# Patient Record
Sex: Female | Born: 1955 | Race: White | Hispanic: No | Marital: Married | Smoking: Never smoker
Health system: Southern US, Community
[De-identification: ages and names within clinical notes are randomized; demographics above are authoritative.]

## PROBLEM LIST (undated history)

## (undated) DIAGNOSIS — A630 Anogenital (venereal) warts: Secondary | ICD-10-CM

## (undated) HISTORY — PX: THYROID CYST EXCISION: SHX2511

## (undated) HISTORY — PX: TONSILLECTOMY: SUR1361

## (undated) HISTORY — PX: WISDOM TOOTH EXTRACTION: SHX21

## (undated) HISTORY — DX: Anogenital (venereal) warts: A63.0

---

## 2012-09-11 ENCOUNTER — Other Ambulatory Visit: Payer: Self-pay | Admitting: Obstetrics & Gynecology

## 2012-09-11 DIAGNOSIS — N6452 Nipple discharge: Secondary | ICD-10-CM

## 2012-09-17 ENCOUNTER — Ambulatory Visit
Admission: RE | Admit: 2012-09-17 | Discharge: 2012-09-17 | Disposition: A | Discharge status: Home / Self Care | Payer: Self-pay | Source: Ambulatory Visit | Attending: Obstetrics & Gynecology | Admitting: Obstetrics & Gynecology

## 2012-09-17 ENCOUNTER — Other Ambulatory Visit: Payer: Self-pay | Admitting: Obstetrics & Gynecology

## 2012-09-17 DIAGNOSIS — N6452 Nipple discharge: Secondary | ICD-10-CM

## 2012-10-02 ENCOUNTER — Ambulatory Visit
Admission: RE | Admit: 2012-10-02 | Discharge: 2012-10-02 | Disposition: A | Discharge status: Home / Self Care | Payer: 59 | Source: Ambulatory Visit | Attending: Obstetrics & Gynecology | Admitting: Obstetrics & Gynecology

## 2012-10-02 DIAGNOSIS — N6452 Nipple discharge: Secondary | ICD-10-CM

## 2014-04-30 ENCOUNTER — Other Ambulatory Visit: Payer: Self-pay

## 2014-04-30 DIAGNOSIS — Z1231 Encounter for screening mammogram for malignant neoplasm of breast: Secondary | ICD-10-CM

## 2014-05-01 ENCOUNTER — Ambulatory Visit
Admission: RE | Admit: 2014-05-01 | Discharge: 2014-05-01 | Disposition: A | Discharge status: Home / Self Care | Payer: 59 | Source: Ambulatory Visit

## 2014-05-01 DIAGNOSIS — Z1231 Encounter for screening mammogram for malignant neoplasm of breast: Secondary | ICD-10-CM

## 2014-08-08 ENCOUNTER — Other Ambulatory Visit: Payer: Self-pay | Admitting: Nurse Practitioner

## 2014-08-08 DIAGNOSIS — N643 Galactorrhea not associated with childbirth: Secondary | ICD-10-CM

## 2014-11-14 ENCOUNTER — Other Ambulatory Visit: Payer: Self-pay | Admitting: Nurse Practitioner

## 2014-11-14 DIAGNOSIS — N643 Galactorrhea not associated with childbirth: Secondary | ICD-10-CM

## 2015-04-13 ENCOUNTER — Other Ambulatory Visit: Payer: Self-pay

## 2015-04-13 DIAGNOSIS — Z1231 Encounter for screening mammogram for malignant neoplasm of breast: Secondary | ICD-10-CM

## 2015-05-12 ENCOUNTER — Ambulatory Visit
Admission: RE | Admit: 2015-05-12 | Discharge: 2015-05-12 | Disposition: A | Discharge status: Home / Self Care | Payer: 59 | Source: Ambulatory Visit | Attending: Nurse Practitioner | Admitting: Nurse Practitioner

## 2015-05-12 ENCOUNTER — Ambulatory Visit
Admission: RE | Admit: 2015-05-12 | Discharge: 2015-05-12 | Disposition: A | Discharge status: Home / Self Care | Payer: 59 | Source: Ambulatory Visit

## 2015-05-12 ENCOUNTER — Other Ambulatory Visit: Payer: Self-pay

## 2015-05-12 ENCOUNTER — Other Ambulatory Visit: Payer: Self-pay | Admitting: Nurse Practitioner

## 2015-05-12 DIAGNOSIS — N643 Galactorrhea not associated with childbirth: Secondary | ICD-10-CM

## 2015-05-12 DIAGNOSIS — Z1231 Encounter for screening mammogram for malignant neoplasm of breast: Secondary | ICD-10-CM

## 2015-05-13 ENCOUNTER — Other Ambulatory Visit: Payer: Self-pay | Admitting: Nurse Practitioner

## 2015-05-13 DIAGNOSIS — N6452 Nipple discharge: Secondary | ICD-10-CM

## 2015-05-20 ENCOUNTER — Other Ambulatory Visit: Payer: Self-pay

## 2015-05-20 ENCOUNTER — Ambulatory Visit
Admission: RE | Admit: 2015-05-20 | Discharge: 2015-05-20 | Disposition: A | Discharge status: Home / Self Care | Payer: 59 | Source: Ambulatory Visit | Attending: Nurse Practitioner | Admitting: Nurse Practitioner

## 2015-05-20 ENCOUNTER — Other Ambulatory Visit: Payer: Self-pay | Admitting: Nurse Practitioner

## 2015-05-20 DIAGNOSIS — N6452 Nipple discharge: Secondary | ICD-10-CM

## 2015-05-20 MED ORDER — GADOBENATE DIMEGLUMINE 529 MG/ML IV SOLN
11.0000 mL | Freq: Once | INTRAVENOUS | Status: AC | PRN
  Administered 2015-05-20: 11 mL via INTRAVENOUS

## 2015-05-25 ENCOUNTER — Ambulatory Visit
Admission: RE | Admit: 2015-05-25 | Discharge: 2015-05-25 | Disposition: A | Discharge status: Home / Self Care | Payer: 59 | Source: Ambulatory Visit | Attending: Nurse Practitioner | Admitting: Nurse Practitioner

## 2015-05-25 DIAGNOSIS — N6452 Nipple discharge: Secondary | ICD-10-CM

## 2015-05-27 ENCOUNTER — Ambulatory Visit
Admission: RE | Admit: 2015-05-27 | Discharge: 2015-05-27 | Disposition: A | Discharge status: Home / Self Care | Payer: 59 | Source: Ambulatory Visit | Attending: Nurse Practitioner | Admitting: Nurse Practitioner

## 2015-05-29 ENCOUNTER — Other Ambulatory Visit: Payer: Self-pay | Admitting: General Surgery

## 2015-05-29 ENCOUNTER — Other Ambulatory Visit: Payer: Self-pay | Admitting: Nurse Practitioner

## 2015-05-29 DIAGNOSIS — R9389 Abnormal findings on diagnostic imaging of other specified body structures: Secondary | ICD-10-CM

## 2015-05-29 DIAGNOSIS — N63 Unspecified lump in unspecified breast: Secondary | ICD-10-CM

## 2015-06-04 ENCOUNTER — Ambulatory Visit
Admission: RE | Admit: 2015-06-04 | Discharge: 2015-06-04 | Disposition: A | Discharge status: Home / Self Care | Payer: 59 | Source: Ambulatory Visit | Attending: General Surgery | Admitting: General Surgery

## 2015-06-04 DIAGNOSIS — N63 Unspecified lump in unspecified breast: Secondary | ICD-10-CM

## 2015-06-04 DIAGNOSIS — R9389 Abnormal findings on diagnostic imaging of other specified body structures: Secondary | ICD-10-CM

## 2015-06-04 HISTORY — PX: BREAST BIOPSY: SHX20

## 2015-06-04 MED ORDER — GADOBENATE DIMEGLUMINE 529 MG/ML IV SOLN
10.0000 mL | Freq: Once | INTRAVENOUS | Status: AC | PRN
  Administered 2015-06-04: 10 mL via INTRAVENOUS

## 2015-06-09 ENCOUNTER — Other Ambulatory Visit: Payer: Self-pay | Admitting: General Surgery

## 2015-06-09 DIAGNOSIS — D241 Benign neoplasm of right breast: Secondary | ICD-10-CM

## 2015-06-24 ENCOUNTER — Other Ambulatory Visit: Payer: Self-pay | Admitting: General Surgery

## 2015-06-24 DIAGNOSIS — D241 Benign neoplasm of right breast: Secondary | ICD-10-CM

## 2015-07-08 ENCOUNTER — Encounter (HOSPITAL_BASED_OUTPATIENT_CLINIC_OR_DEPARTMENT_OTHER): Payer: Self-pay | Admitting: *Deleted

## 2015-07-15 ENCOUNTER — Ambulatory Visit
Admission: RE | Admit: 2015-07-15 | Discharge: 2015-07-15 | Disposition: A | Discharge status: Home / Self Care | Payer: 59 | Source: Ambulatory Visit | Attending: General Surgery | Admitting: General Surgery

## 2015-07-15 ENCOUNTER — Encounter (HOSPITAL_BASED_OUTPATIENT_CLINIC_OR_DEPARTMENT_OTHER)
Admission: RE | Admit: 2015-07-15 | Discharge: 2015-07-15 | Disposition: A | Discharge status: Home / Self Care | Payer: 59 | Source: Ambulatory Visit | Attending: General Surgery | Admitting: General Surgery

## 2015-07-15 DIAGNOSIS — D241 Benign neoplasm of right breast: Secondary | ICD-10-CM

## 2015-07-15 NOTE — Progress Notes (Signed)
Boost given to pt and instructed to complete by 0415, pt verbalized understanding.

## 2015-07-16 ENCOUNTER — Ambulatory Visit (HOSPITAL_BASED_OUTPATIENT_CLINIC_OR_DEPARTMENT_OTHER)
Admission: RE | Admit: 2015-07-16 | Discharge: 2015-07-16 | Disposition: A | Discharge status: Home / Self Care | Payer: 59 | Source: Ambulatory Visit | Attending: General Surgery | Admitting: General Surgery

## 2015-07-16 ENCOUNTER — Ambulatory Visit (HOSPITAL_BASED_OUTPATIENT_CLINIC_OR_DEPARTMENT_OTHER): Payer: 59 | Admitting: Certified Registered"

## 2015-07-16 ENCOUNTER — Ambulatory Visit
Admission: RE | Admit: 2015-07-16 | Discharge: 2015-07-16 | Disposition: A | Discharge status: Home / Self Care | Payer: 59 | Source: Ambulatory Visit | Attending: General Surgery | Admitting: General Surgery

## 2015-07-16 ENCOUNTER — Encounter (HOSPITAL_BASED_OUTPATIENT_CLINIC_OR_DEPARTMENT_OTHER)
Admission: RE | Disposition: A | Discharge status: Home / Self Care | Payer: Self-pay | Source: Ambulatory Visit | Attending: General Surgery

## 2015-07-16 ENCOUNTER — Encounter (HOSPITAL_BASED_OUTPATIENT_CLINIC_OR_DEPARTMENT_OTHER): Payer: Self-pay | Admitting: *Deleted

## 2015-07-16 DIAGNOSIS — D241 Benign neoplasm of right breast: Secondary | ICD-10-CM | POA: Diagnosis present

## 2015-07-16 HISTORY — PX: BREAST LUMPECTOMY WITH RADIOACTIVE SEED LOCALIZATION: SHX6424

## 2015-07-16 HISTORY — PX: BREAST EXCISIONAL BIOPSY: SUR124

## 2015-07-16 SURGERY — BREAST LUMPECTOMY WITH RADIOACTIVE SEED LOCALIZATION
Anesthesia: General | Site: Breast | Laterality: Right

## 2015-07-16 MED ORDER — HEPARIN SOD (PORK) LOCK FLUSH 100 UNIT/ML IV SOLN
INTRAVENOUS | Status: AC
  Filled 2015-07-16: qty 5

## 2015-07-16 MED ORDER — HEPARIN (PORCINE) IN NACL 2-0.9 UNIT/ML-% IJ SOLN
INTRAMUSCULAR | Status: AC
  Filled 2015-07-16: qty 500

## 2015-07-16 MED ORDER — FENTANYL CITRATE (PF) 100 MCG/2ML IJ SOLN: 25.0000 ug | INTRAMUSCULAR | Status: DC | PRN

## 2015-07-16 MED ORDER — BUPIVACAINE HCL (PF) 0.25 % IJ SOLN
INTRAMUSCULAR | Status: AC
  Filled 2015-07-16: qty 120

## 2015-07-16 MED ORDER — MIDAZOLAM HCL 2 MG/2ML IJ SOLN
INTRAMUSCULAR | Status: AC
  Filled 2015-07-16: qty 2

## 2015-07-16 MED ORDER — LIDOCAINE 2% (20 MG/ML) 5 ML SYRINGE
INTRAMUSCULAR | Status: AC
  Filled 2015-07-16: qty 5

## 2015-07-16 MED ORDER — DEXAMETHASONE SODIUM PHOSPHATE 10 MG/ML IJ SOLN
INTRAMUSCULAR | Status: AC
  Filled 2015-07-16: qty 1

## 2015-07-16 MED ORDER — HYDROCODONE-ACETAMINOPHEN 5-325 MG PO TABS
ORAL_TABLET | ORAL | Status: AC
  Filled 2015-07-16: qty 1

## 2015-07-16 MED ORDER — HYDROCODONE-ACETAMINOPHEN 5-325 MG PO TABS
1.0000 | ORAL_TABLET | Freq: Once | ORAL | Status: AC
  Administered 2015-07-16: 1 via ORAL

## 2015-07-16 MED ORDER — BUPIVACAINE-EPINEPHRINE (PF) 0.25% -1:200000 IJ SOLN
INTRAMUSCULAR | Status: AC
  Filled 2015-07-16: qty 120

## 2015-07-16 MED ORDER — PROMETHAZINE HCL 25 MG/ML IJ SOLN: 6.2500 mg | INTRAMUSCULAR | Status: DC | PRN

## 2015-07-16 MED ORDER — PROPOFOL 500 MG/50ML IV EMUL
INTRAVENOUS | Status: AC
  Filled 2015-07-16: qty 50

## 2015-07-16 MED ORDER — DEXAMETHASONE SODIUM PHOSPHATE 4 MG/ML IJ SOLN
INTRAMUSCULAR | Status: DC | PRN
  Administered 2015-07-16: 10 mg via INTRAVENOUS

## 2015-07-16 MED ORDER — SCOPOLAMINE 1 MG/3DAYS TD PT72
1.0000 | MEDICATED_PATCH | Freq: Once | TRANSDERMAL | Status: DC | PRN
Start: 2015-07-16 — End: 2015-07-16

## 2015-07-16 MED ORDER — MIDAZOLAM HCL 2 MG/2ML IJ SOLN
1.0000 mg | INTRAMUSCULAR | Status: DC | PRN
  Administered 2015-07-16: 1 mg via INTRAVENOUS

## 2015-07-16 MED ORDER — CHLORHEXIDINE GLUCONATE 4 % EX LIQD: 1.0000 "application " | Freq: Once | CUTANEOUS | Status: DC

## 2015-07-16 MED ORDER — HYDROCODONE-ACETAMINOPHEN 5-325 MG PO TABS: 1.0000 | ORAL_TABLET | Freq: Four times a day (QID) | ORAL | Status: DC | PRN

## 2015-07-16 MED ORDER — BUPIVACAINE HCL (PF) 0.25 % IJ SOLN
INTRAMUSCULAR | Status: DC | PRN
  Administered 2015-07-16: 20 mL

## 2015-07-16 MED ORDER — PROPOFOL 10 MG/ML IV BOLUS
INTRAVENOUS | Status: DC | PRN
  Administered 2015-07-16: 120 mg via INTRAVENOUS

## 2015-07-16 MED ORDER — CEFAZOLIN SODIUM-DEXTROSE 2-4 GM/100ML-% IV SOLN
2.0000 g | INTRAVENOUS | Status: AC
  Administered 2015-07-16: 2 g via INTRAVENOUS

## 2015-07-16 MED ORDER — FENTANYL CITRATE (PF) 100 MCG/2ML IJ SOLN
INTRAMUSCULAR | Status: AC
  Filled 2015-07-16: qty 2

## 2015-07-16 MED ORDER — ONDANSETRON HCL 4 MG/2ML IJ SOLN
INTRAMUSCULAR | Status: AC
  Filled 2015-07-16: qty 2

## 2015-07-16 MED ORDER — IOPAMIDOL (ISOVUE-300) INJECTION 61%
INTRAVENOUS | Status: AC
  Filled 2015-07-16: qty 50

## 2015-07-16 MED ORDER — LACTATED RINGERS IV SOLN
INTRAVENOUS | Status: DC
  Administered 2015-07-16 (×2): via INTRAVENOUS

## 2015-07-16 MED ORDER — GLYCOPYRROLATE 0.2 MG/ML IJ SOLN
0.2000 mg | Freq: Once | INTRAMUSCULAR | Status: DC | PRN
Start: 2015-07-16 — End: 2015-07-16

## 2015-07-16 MED ORDER — ONDANSETRON HCL 4 MG/2ML IJ SOLN
INTRAMUSCULAR | Status: DC | PRN
  Administered 2015-07-16: 4 mg via INTRAVENOUS

## 2015-07-16 MED ORDER — LIDOCAINE HCL (CARDIAC) 20 MG/ML IV SOLN
INTRAVENOUS | Status: DC | PRN
  Administered 2015-07-16: 100 mg via INTRAVENOUS

## 2015-07-16 MED ORDER — CEFAZOLIN SODIUM-DEXTROSE 2-4 GM/100ML-% IV SOLN
INTRAVENOUS | Status: AC
  Filled 2015-07-16: qty 100

## 2015-07-16 MED ORDER — FENTANYL CITRATE (PF) 100 MCG/2ML IJ SOLN
50.0000 ug | INTRAMUSCULAR | Status: DC | PRN
  Administered 2015-07-16: 50 ug via INTRAVENOUS

## 2015-07-16 SURGICAL SUPPLY — 43 items
APPLIER CLIP 9.375 MED OPEN (MISCELLANEOUS) ×3
BLADE SURG 15 STRL LF DISP TIS (BLADE) ×1 IMPLANT
BLADE SURG 15 STRL SS (BLADE) ×2
CANISTER SUC SOCK COL 7IN (MISCELLANEOUS) ×3 IMPLANT
CANISTER SUCT 1200ML W/VALVE (MISCELLANEOUS) ×3 IMPLANT
CHLORAPREP W/TINT 26ML (MISCELLANEOUS) ×3 IMPLANT
CLIP APPLIE 9.375 MED OPEN (MISCELLANEOUS) ×1 IMPLANT
COVER BACK TABLE 60X90IN (DRAPES) ×3 IMPLANT
COVER MAYO STAND STRL (DRAPES) ×3 IMPLANT
COVER PROBE W GEL 5X96 (DRAPES) ×3 IMPLANT
DECANTER SPIKE VIAL GLASS SM (MISCELLANEOUS) IMPLANT
DEVICE DUBIN W/COMP PLATE 8390 (MISCELLANEOUS) ×3 IMPLANT
DRAPE LAPAROSCOPIC ABDOMINAL (DRAPES) ×3 IMPLANT
DRAPE UTILITY XL STRL (DRAPES) ×3 IMPLANT
ELECT COATED BLADE 2.86 ST (ELECTRODE) ×3 IMPLANT
ELECT REM PT RETURN 9FT ADLT (ELECTROSURGICAL) ×3
ELECTRODE REM PT RTRN 9FT ADLT (ELECTROSURGICAL) ×1 IMPLANT
GLOVE BIO SURGEON STRL SZ 6.5 (GLOVE) ×8 IMPLANT
GLOVE BIO SURGEON STRL SZ7.5 (GLOVE) ×6 IMPLANT
GLOVE BIO SURGEONS STRL SZ 6.5 (GLOVE) ×4
GLOVE BIOGEL PI IND STRL 7.0 (GLOVE) ×4 IMPLANT
GLOVE BIOGEL PI INDICATOR 7.0 (GLOVE) ×8
GOWN STRL REUS W/ TWL LRG LVL3 (GOWN DISPOSABLE) ×4 IMPLANT
GOWN STRL REUS W/TWL LRG LVL3 (GOWN DISPOSABLE) ×8
ILLUMINATOR WAVEGUIDE N/F (MISCELLANEOUS) IMPLANT
KIT MARKER MARGIN INK (KITS) ×3 IMPLANT
LIGHT WAVEGUIDE WIDE FLAT (MISCELLANEOUS) IMPLANT
LIQUID BAND (GAUZE/BANDAGES/DRESSINGS) ×3 IMPLANT
NEEDLE HYPO 25X1 1.5 SAFETY (NEEDLE) ×3 IMPLANT
NS IRRIG 1000ML POUR BTL (IV SOLUTION) ×3 IMPLANT
PACK BASIN DAY SURGERY FS (CUSTOM PROCEDURE TRAY) ×3 IMPLANT
PENCIL BUTTON HOLSTER BLD 10FT (ELECTRODE) ×3 IMPLANT
SLEEVE SCD COMPRESS KNEE MED (MISCELLANEOUS) ×3 IMPLANT
SPONGE LAP 18X18 X RAY DECT (DISPOSABLE) ×3 IMPLANT
SUT MON AB 4-0 PC3 18 (SUTURE) IMPLANT
SUT SILK 2 0 SH (SUTURE) IMPLANT
SUT VICRYL 3-0 CR8 SH (SUTURE) ×3 IMPLANT
SYR CONTROL 10ML LL (SYRINGE) ×3 IMPLANT
TOWEL OR 17X24 6PK STRL BLUE (TOWEL DISPOSABLE) ×3 IMPLANT
TOWEL OR NON WOVEN STRL DISP B (DISPOSABLE) ×3 IMPLANT
TUBE CONNECTING 20'X1/4 (TUBING) ×1
TUBE CONNECTING 20X1/4 (TUBING) ×2 IMPLANT
YANKAUER SUCT BULB TIP NO VENT (SUCTIONS) IMPLANT

## 2015-07-16 NOTE — Op Note (Signed)
07/16/2015  8:23 AM  PATIENT:  Christina Gross  60 y.o. female  PRE-OPERATIVE DIAGNOSIS:  Right breast papilloma   POST-OPERATIVE DIAGNOSIS:  Right breast papilloma   PROCEDURE:  Procedure(s): RIGHT BREAST LUMPECTOMY WITH RADIOACTIVE SEED LOCALIZATION (Right)  SURGEON:  Surgeon(s) and Role:    * Jovita Kussmaul, MD - Primary  PHYSICIAN ASSISTANT:   ASSISTANTS: none   ANESTHESIA:   general  EBL:  Total I/O In: 1000 [I.V.:1000] Out: -   BLOOD ADMINISTERED:none  DRAINS: none   LOCAL MEDICATIONS USED:  MARCAINE     SPECIMEN:  Source of Specimen:  right breast tissue  DISPOSITION OF SPECIMEN:  PATHOLOGY  COUNTS:  YES  TOURNIQUET:  * No tourniquets in log *  DICTATION: .Dragon Dictation   After informed consent was obtained the patient was brought to the operating room and placed in the supine position on the operating table. After adequate induction of general anesthesia the patient's right breast was prepped with ChloraPrep, allowed to dry, and draped in usual sterile manner. An appropriate timeout was performed. Previously an I-125 seed was placed in the upper portion of the right breast to mark an area of an intraductal papilloma. The neoprobe was set to I-125 in the area of radioactivity was readily identified in the upper portion of the right breast near the areola. The area around this spot was infiltrated with quarter percent Marcaine. A small curvilinear incision was made along the upper edge of the areola on the right breast with a 15 blade knife. The incision was carried through the skin and subcutaneous tissue sharply with electrocautery.  Skin hooks were used for retraction. While checking the area of radioactivity frequently with the neoprobe a circular portion of breast tissue was excised sharply around the radioactive seed with the electrocautery. Once the specimen was removed it was oriented with the appropriate paint colors. A specimen radiograph was obtained  that showed the clip and seed to be near the center of the specimen. The specimen was then sent to pathology for further evaluation. Hemostasis was achieved using the Bovie electrocautery. The wound was irrigated with saline and infiltrated with more quarter percent Marcaine. The deep layer of the wound was closed with layers of interrupted 3-0 Vicryl stitches. The skin was then closed with interrupted 4-0 Monocryl subcuticular stitches. Dermabond dressings were applied. The patient tolerated the procedure well. At the end of the case all needle sponge and instrument counts were correct. The patient was then awakened and taken to recovery in stable condition.  PLAN OF CARE: Discharge to home after PACU  PATIENT DISPOSITION:  PACU - hemodynamically stable.   Delay start of Pharmacological VTE agent (>24hrs) due to surgical blood loss or risk of bleeding: not applicable

## 2015-07-16 NOTE — Anesthesia Preprocedure Evaluation (Addendum)
Anesthesia Evaluation  Patient identified by MRN, date of birth, ID band Patient awake    Reviewed: Allergy & Precautions, NPO status , Patient's Chart, lab work & pertinent test results  Airway Mallampati: II  TM Distance: >3 FB Neck ROM: Full    Dental  (+) Teeth Intact, Dental Advisory Given   Pulmonary neg pulmonary ROS,    Pulmonary exam normal breath sounds clear to auscultation       Cardiovascular Exercise Tolerance: Good negative cardio ROS Normal cardiovascular exam Rhythm:Regular Rate:Normal     Neuro/Psych negative neurological ROS     GI/Hepatic negative GI ROS, Neg liver ROS,   Endo/Other  negative endocrine ROS  Renal/GU negative Renal ROS     Musculoskeletal negative musculoskeletal ROS (+)   Abdominal   Peds  Hematology negative hematology ROS (+)   Anesthesia Other Findings Day of surgery medications reviewed with the patient.  Right breast cancer  Reproductive/Obstetrics                           Anesthesia Physical Anesthesia Plan  ASA: II  Anesthesia Plan: General   Post-op Pain Management:    Induction: Intravenous  Airway Management Planned: LMA  Additional Equipment:   Intra-op Plan:   Post-operative Plan: Extubation in OR  Informed Consent: I have reviewed the patients History and Physical, chart, labs and discussed the procedure including the risks, benefits and alternatives for the proposed anesthesia with the patient or authorized representative who has indicated his/her understanding and acceptance.   Dental advisory given  Plan Discussed with: CRNA  Anesthesia Plan Comments: (Risks/benefits of general anesthesia discussed with patient including risk of damage to teeth, lips, gum, and tongue, nausea/vomiting, allergic reactions to medications, and the possibility of heart attack, stroke and death.  All patient questions answered.  Patient wishes  to proceed.)        Anesthesia Quick Evaluation

## 2015-07-16 NOTE — Anesthesia Procedure Notes (Signed)
Procedure Name: LMA Insertion Date/Time: 07/16/2015 7:30 AM Performed by: Deuntae Kocsis D Pre-anesthesia Checklist: Patient identified, Emergency Drugs available, Suction available and Patient being monitored Patient Re-evaluated:Patient Re-evaluated prior to inductionOxygen Delivery Method: Circle system utilized Preoxygenation: Pre-oxygenation with 100% oxygen Intubation Type: IV induction Ventilation: Mask ventilation without difficulty LMA: LMA inserted LMA Size: 4.0 Number of attempts: 1 Airway Equipment and Method: Bite block Placement Confirmation: positive ETCO2 Tube secured with: Tape Dental Injury: Teeth and Oropharynx as per pre-operative assessment

## 2015-07-16 NOTE — Anesthesia Postprocedure Evaluation (Signed)
Anesthesia Post Note  Patient: Christina Gross  Procedure(s) Performed: Procedure(s) (LRB): RIGHT BREAST LUMPECTOMY WITH RADIOACTIVE SEED LOCALIZATION (Right)  Patient location during evaluation: PACU Anesthesia Type: General Level of consciousness: awake and alert Pain management: pain level controlled Vital Signs Assessment: post-procedure vital signs reviewed and stable Respiratory status: spontaneous breathing, nonlabored ventilation, respiratory function stable and patient connected to nasal cannula oxygen Cardiovascular status: blood pressure returned to baseline and stable Postop Assessment: no signs of nausea or vomiting Anesthetic complications: no    Last Vitals:  Filed Vitals:   07/16/15 0915 07/16/15 0926  BP: 122/71   Pulse: 51 103  Temp:  36.2 C  Resp: 13 16    Last Pain:  Filed Vitals:   07/16/15 0927  PainSc: 4                  Catalina Gravel

## 2015-07-16 NOTE — Interval H&P Note (Signed)
History and Physical Interval Note:  07/16/2015 7:12 AM  Christina Gross  has presented today for surgery, with the diagnosis of Right breast papilloma   The various methods of treatment have been discussed with the patient and family. After consideration of risks, benefits and other options for treatment, the patient has consented to  Procedure(s): RIGHT BREAST LUMPECTOMY WITH RADIOACTIVE SEED LOCALIZATION (Right) as a surgical intervention .  The patient's history has been reviewed, patient examined, no change in status, stable for surgery.  I have reviewed the patient's chart and labs.  Questions were answered to the patient's satisfaction.     TOTH III,Ernesta Trabert S

## 2015-07-16 NOTE — Transfer of Care (Signed)
Immediate Anesthesia Transfer of Care Note  Patient: Christina Gross  Procedure(s) Performed: Procedure(s): RIGHT BREAST LUMPECTOMY WITH RADIOACTIVE SEED LOCALIZATION (Right)  Patient Location: PACU  Anesthesia Type:General  Level of Consciousness: awake and patient cooperative  Airway & Oxygen Therapy: Patient Spontanous Breathing and Patient connected to face mask oxygen  Post-op Assessment: Report given to RN and Post -op Vital signs reviewed and stable  Post vital signs: Reviewed and stable  Last Vitals:  Filed Vitals:   07/16/15 0624 07/16/15 0828  Pulse: 56 60  Temp: 36.4 C   Resp: 18     Last Pain: There were no vitals filed for this visit.       Complications: No apparent anesthesia complications

## 2015-07-16 NOTE — H&P (Signed)
Christina Gross  Location: [facility] Patient #: [id number] DOB: 1955/02/26 Married / Language: English / Race: White Female   History of Present Illness  The patient is a 60 year old female who presents for a follow-up for Breast mass. The patient is a 60 year old female who initially presented with right nipple discharge. Her MRI identified a small area of enhancement in the upper central right breast. This was biopsied and came back as an intraductal papilloma. She continues to have the discharge.   Allergies  No Known Drug Allergies04/27/2017  Medication History  No Current Medications Medications Reconciled    Review of Systems  General Present- Night Sweats. Not Present- Appetite Loss, Chills, Fatigue, Fever, Weight Gain and Weight Loss. Skin Not Present- Change in Wart/Mole, Dryness, Hives, Jaundice, New Lesions, Non-Healing Wounds, Rash and Ulcer. HEENT Present- Wears glasses/contact lenses. Not Present- Earache, Hearing Loss, Hoarseness, Nose Bleed, Oral Ulcers, Ringing in the Ears, Seasonal Allergies, Sinus Pain, Sore Throat, Visual Disturbances and Yellow Eyes. Respiratory Not Present- Bloody sputum, Chronic Cough, Difficulty Breathing, Snoring and Wheezing. Breast Present- Nipple Discharge. Not Present- Breast Mass, Breast Pain and Skin Changes. Gastrointestinal Not Present- Abdominal Pain, Bloating, Bloody Stool, Change in Bowel Habits, Chronic diarrhea, Constipation, Difficulty Swallowing, Excessive gas, Gets full quickly at meals, Hemorrhoids, Indigestion, Nausea, Rectal Pain and Vomiting. Psychiatric Not Present- Anxiety, Bipolar, Change in Sleep Pattern, Depression, Fearful and Frequent crying. Endocrine Not Present- Cold Intolerance, Excessive Hunger, Hair Changes, Heat Intolerance, Hot flashes and New Diabetes. Hematology Not Present- Easy Bruising, Excessive bleeding, Gland problems, HIV and Persistent Infections.  Vitals Weight: 127.8  lb Height: 60in Body Surface Area: 1.54 m Body Mass Index: 24.96 kg/m  Temp.: 97.73F  Pulse: 96 (Regular)  BP: 124/74 (Sitting, Left Arm, Standard)       Physical Exam  General Mental Status-Alert. General Appearance-Consistent with stated age. Hydration-Well hydrated. Voice-Normal.  Head and Neck Head-normocephalic, atraumatic with no lesions or palpable masses. Trachea-midline. Thyroid Gland Characteristics - normal size and consistency.  Eye Eyeball - Bilateral-Extraocular movements intact. Sclera/Conjunctiva - Bilateral-No scleral icterus.  Chest and Lung Exam Chest and lung exam reveals -quiet, even and easy respiratory effort with no use of accessory muscles and on auscultation, normal breath sounds, no adventitious sounds and normal vocal resonance. Inspection Chest Wall - Normal. Back - normal.  Breast Note: There is no palpable mass in either breast. There is no palpable axillary, supraclavicular, or cervical lymphadenopathy. She has some symmetric density to the breast tissue in the upper portion of both breasts. There is no reproducible discharge from the right nipple today.   Cardiovascular Cardiovascular examination reveals -normal heart sounds, regular rate and rhythm with no murmurs and normal pedal pulses bilaterally.  Abdomen Inspection Inspection of the abdomen reveals - No Hernias. Skin - Scar - no surgical scars. Palpation/Percussion Palpation and Percussion of the abdomen reveal - Soft, Non Tender, No Rebound tenderness, No Rigidity (guarding) and No hepatosplenomegaly. Auscultation Auscultation of the abdomen reveals - Bowel sounds normal.  Neurologic Neurologic evaluation reveals -alert and oriented x 3 with no impairment of recent or remote memory. Mental Status-Normal.  Musculoskeletal Normal Exam - Left-Upper Extremity Strength Normal and Lower Extremity Strength Normal. Normal Exam -  Right-Upper Extremity Strength Normal and Lower Extremity Strength Normal.  Lymphatic Head & Neck  General Head & Neck Lymphatics: Bilateral - Description - Normal. Axillary  General Axillary Region: Bilateral - Description - Normal. Tenderness - Non Tender. Femoral & Inguinal  Generalized Femoral &  Inguinal Lymphatics: Bilateral - Description - Normal. Tenderness - Non Tender.    Assessment & Plan  PAPILLOMA OF RIGHT BREAST (D24.1) Impression: The patient has what appears to be a papilloma of the upper central right breast. Because of its abnormal appearance on MRI and because of her nipple discharge I think the area should be removed. I have discussed with her in detail the risks and benefits of the operation to remove this area as well as some of the technical aspects and she understands and wishes to proceed. I will plan for a right breast radioactive seed localized lumpectomy    Signed by Luella Cook, MD

## 2015-07-16 NOTE — Discharge Instructions (Signed)

## 2015-07-17 ENCOUNTER — Encounter (HOSPITAL_BASED_OUTPATIENT_CLINIC_OR_DEPARTMENT_OTHER): Payer: Self-pay | Admitting: General Surgery

## 2018-03-18 IMAGING — MR MR BREAST BX W/ LOC DEV 1ST LEASION IMAGE BX SPEC MR GUIDE*R*
6 of 9 series · 33 of 48 positions shown · IV contrast (multihance)
Comparison: Previous exams.

ADDENDUM:
The biopsy clip placed at the time of today's right breast
MRI-guided biopsy is BARBELL SHAPED.

PATHOLOGY ADDENDUM:
Pathology: Intraductal papilloma.
Pathology concordant with imaging findings: Yes
Recommendation: Surgical excision
Localization/excision considerations: None
At the request of the patient, I spoke with her by telephone on
06/05/2015 at [DATE] p.m.. She reports doing well after the biopsy
with mild pain and no bruising or palpable hematoma. The patient has
an appointment with Dr. Jevaishi on 06/09/2015 at [DATE] a.m.
CLINICAL DATA: Patient with irregular enhancing right breast mass
identified on MRI presents today for MRI guided biopsy.
Initial breast MRI was performed for suspicious right nipple
discharge.
EXAM:
MRI GUIDED CORE NEEDLE BIOPSY OF THE RIGHT BREAST
TECHNIQUE: Multiplanar, multisequence MR imaging of the right breast was
performed both before and after administration of intravenous
contrast.
CONTRAST:  10mL MULTIHANCE GADOBENATE DIMEGLUMINE 529 MG/ML IV SOLN

[Series 2: axial pre-cm · axial · non-contrast · 1.3mm · 0.73mm/px · z∈[-102,+84]mm · 6 of 144 slices shown]
[im 1/144]
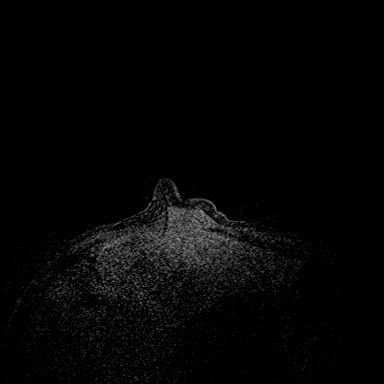
[im 29/144]
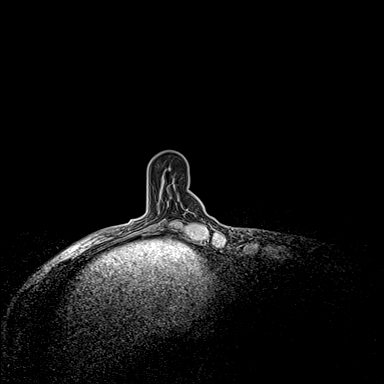
[im 58/144]
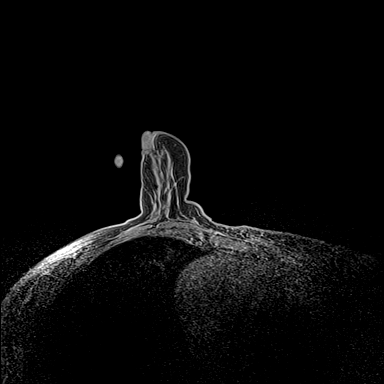
[im 86/144]
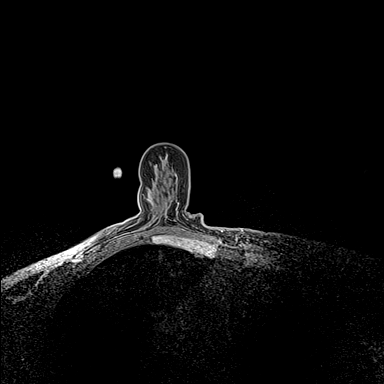
[im 115/144]
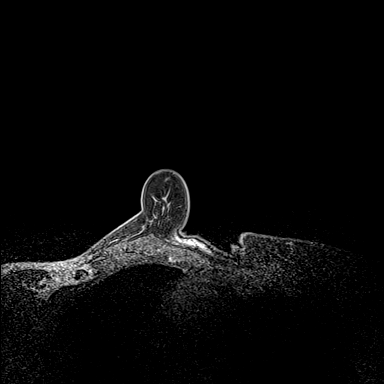
[im 144/144]
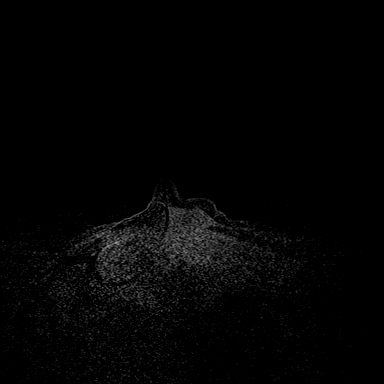

[Series 3: axial pre-cm_s2_(id) · axial · non-contrast · 1.3mm · 0.73mm/px · z∈[-102,+84]mm · 6 of 144 slices shown]
[im 1/144]
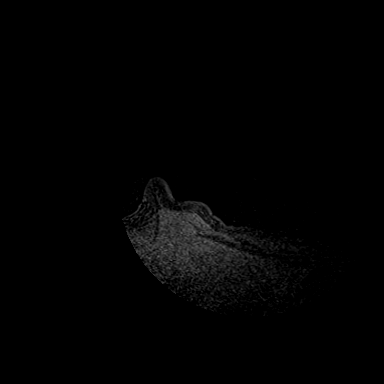
[im 29/144]
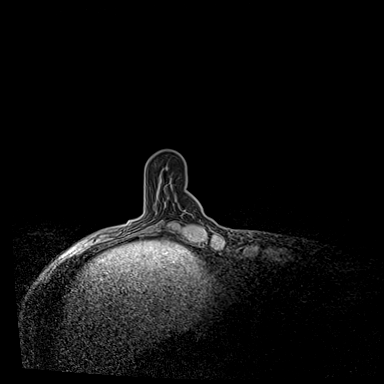
[im 58/144]
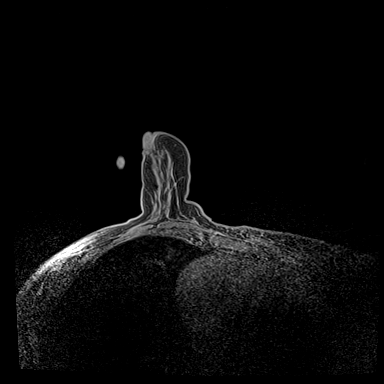
[im 86/144]
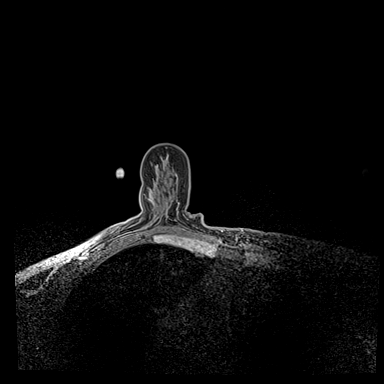
[im 115/144]
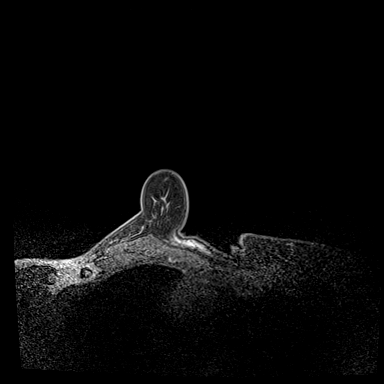
[im 144/144]
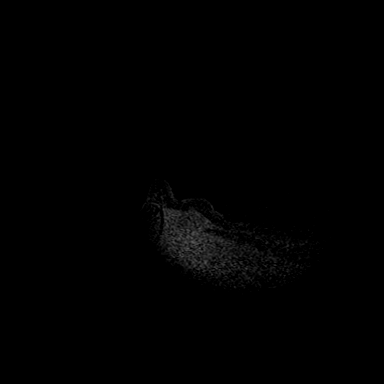

[Series 4: axial post 20 · axial · 1.3mm · 0.73mm/px · z∈[-102,+84]mm · 6 of 144 slices shown (1 of 3)]
[im 1/144]
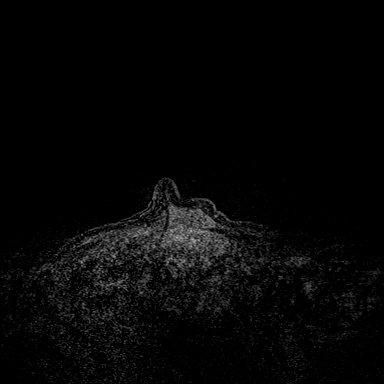
[im 29/144]
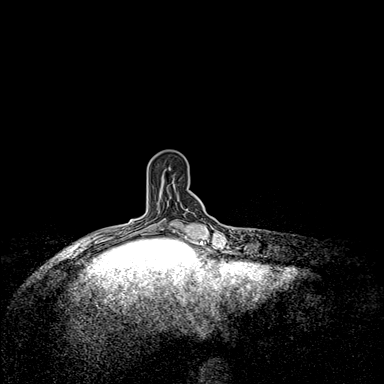
[im 58/144]
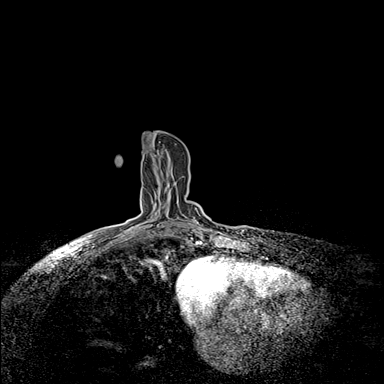
[im 86/144]
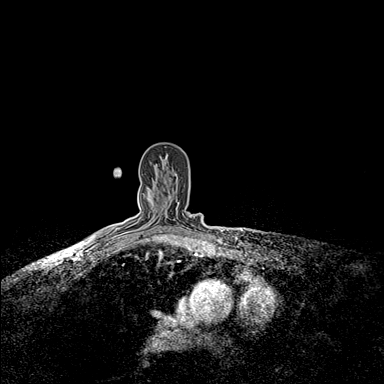
[im 115/144]
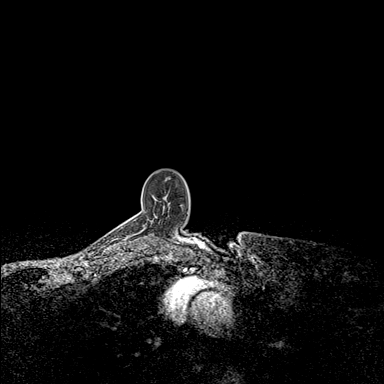
[im 144/144]
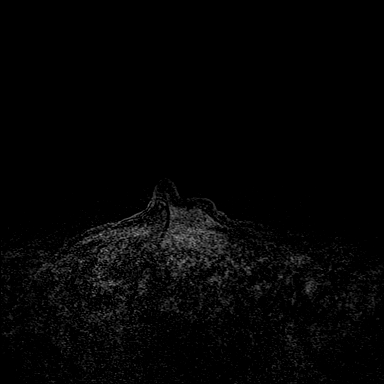

[Series 5: axial post 20 · axial · 1.3mm · 0.73mm/px · z∈[-102,+84]mm · 5 of 144 slices shown (2 of 3)]
[im 1/144]
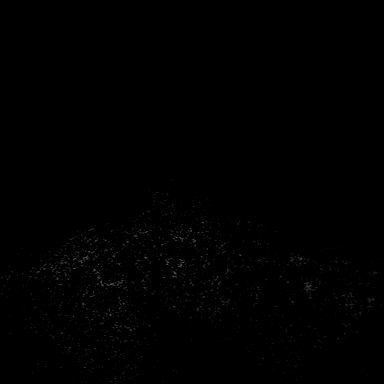
[im 36/144]
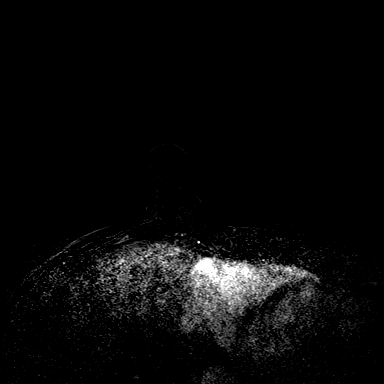
[im 72/144]
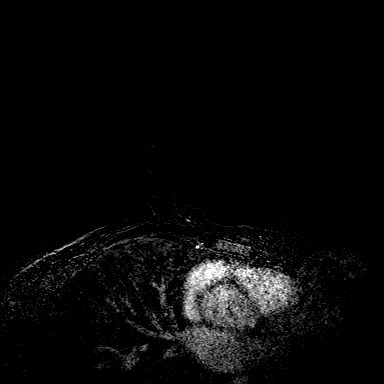
[im 108/144]
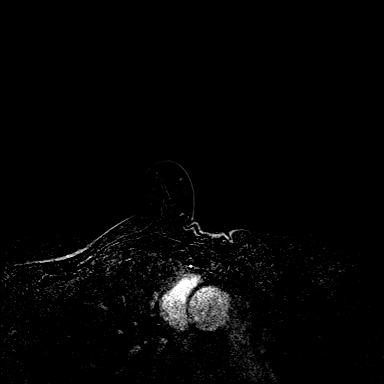
[im 144/144]
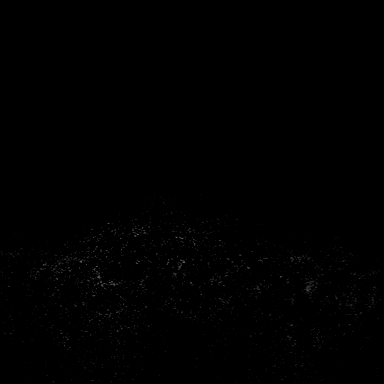

[Series 6: axial post 20 · axial · 1.3mm · 0.73mm/px · z∈[-102,+84]mm · 5 of 144 slices shown (3 of 3)]
[im 1/144]
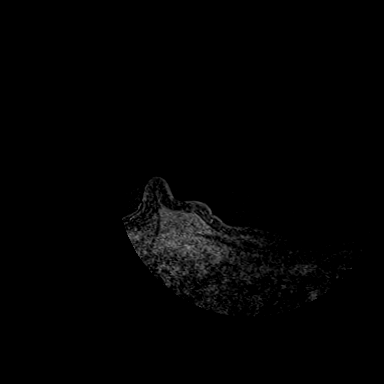
[im 36/144]
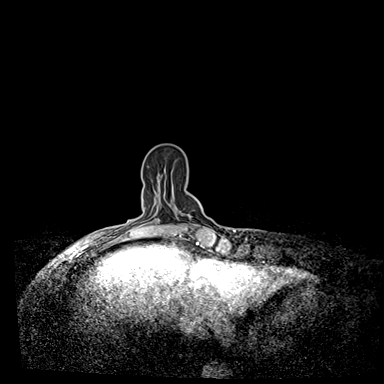
[im 72/144]
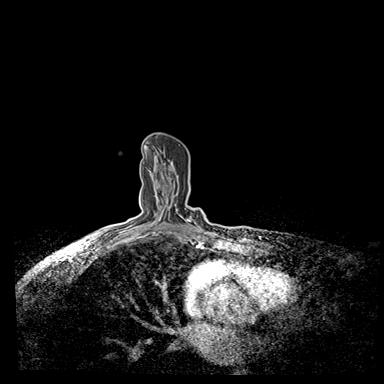
[im 108/144]
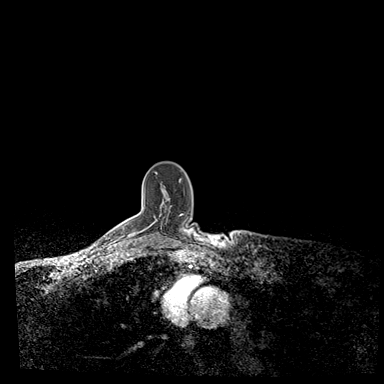
[im 144/144]
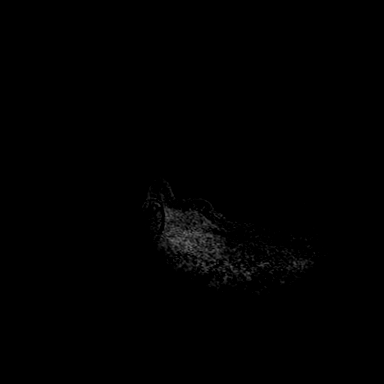

[Series 7: axial confirmation · axial · 1.3mm · 0.73mm/px · z∈[-102,+84]mm · 5 of 144 slices shown]
[im 1/144]
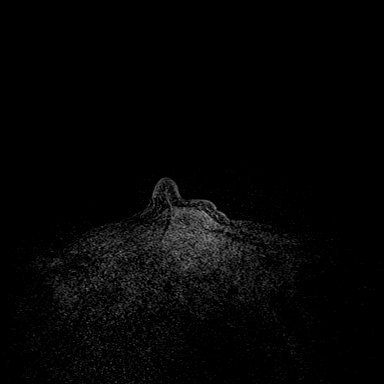
[im 36/144]
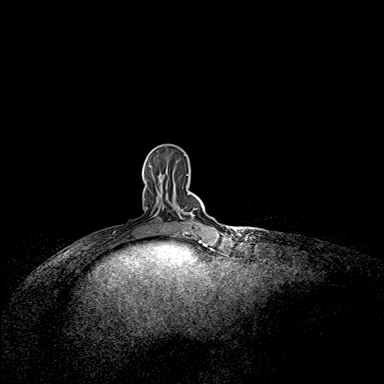
[im 72/144]
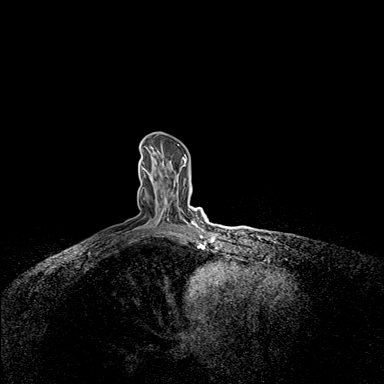
[im 108/144]
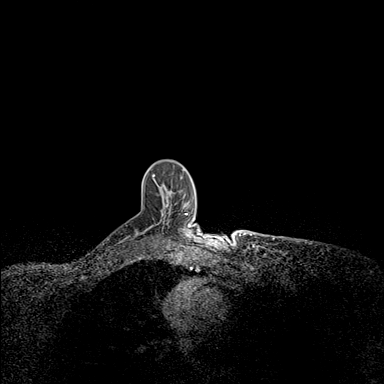
[im 144/144]
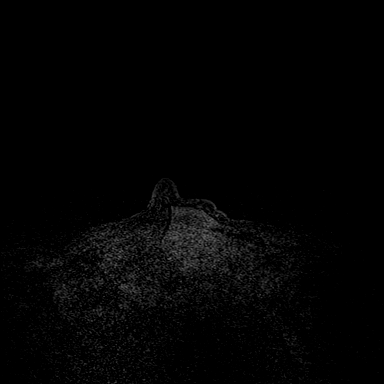

[33 of 48 positions shown; findings below may reference images not displayed]

FINDINGS: I met with the patient, and we discussed the procedure of MRI guided
biopsy, including risks, benefits, and alternatives. Specifically,
we discussed the risks of infection, bleeding, tissue injury, clip
migration, and inadequate sampling. Informed, written consent was
given. The usual time out protocol was performed immediately prior
to the procedure.

Using sterile technique, 1% Lidocaine, MRI guidance, and a 9 gauge
vacuum assisted device, biopsy was performed of the irregular mass
in the right breast, central slightly outer, using a lateral
approach. At the conclusion of the procedure, a rod-shaped tissue
marker clip was deployed into the biopsy cavity. Follow-up 2-view
mammogram was performed and dictated separately.
IMPRESSION: MRI guided biopsy of the irregular enhancing mass within the right
breast, central slightly outer. No apparent complications.

## 2020-01-15 ENCOUNTER — Encounter: Payer: Self-pay | Admitting: Obstetrics and Gynecology

## 2020-01-15 ENCOUNTER — Ambulatory Visit: Payer: 59 | Admitting: Obstetrics and Gynecology

## 2020-01-15 ENCOUNTER — Other Ambulatory Visit: Payer: Self-pay

## 2020-01-15 VITALS — BP 116/72 | HR 47 | Ht 59.25 in | Wt 132.6 lb

## 2020-01-15 DIAGNOSIS — Z1211 Encounter for screening for malignant neoplasm of colon: Secondary | ICD-10-CM | POA: Diagnosis not present

## 2020-01-15 DIAGNOSIS — Z01419 Encounter for gynecological examination (general) (routine) without abnormal findings: Secondary | ICD-10-CM

## 2020-01-15 DIAGNOSIS — E041 Nontoxic single thyroid nodule: Secondary | ICD-10-CM

## 2020-01-15 DIAGNOSIS — Z124 Encounter for screening for malignant neoplasm of cervix: Secondary | ICD-10-CM | POA: Diagnosis not present

## 2020-01-15 DIAGNOSIS — Z0000 Encounter for general adult medical examination without abnormal findings: Secondary | ICD-10-CM | POA: Diagnosis not present

## 2020-01-15 NOTE — Progress Notes (Signed)
64 y.o. G65P2002 Married White or Caucasian Not Hispanic or Latino female here for annual exam.   She notices a piece of flesh on the perineum, not sure if it's growing. No tender, no itchy.  No vaginal bleeding. Not sexually active (husband with medical issues).   No bowel or bladder c/o.     No LMP recorded. Patient is postmenopausal.          Sexually active: No.  The current method of family planning is post menopausal status.    Exercising: Yes.    Walking 40 min a day Smoker:  no  Health Maintenance: Pap:  unsure History of abnormal Pap:  no MMG:  2019 Density C Bi-rads 1 neg  BMD:   None  Colonoscopy: 2010 polyps  TDaP:  unsure Gardasil: NA   reports that she has never smoked. She has never used smokeless tobacco. She reports current alcohol use. She reports that she does not use drugs. Retired, takes care of her grandchildren. 2 kids, daughter and her 2 sons are local. Son and his 2 daughters are in [place].   Past Medical History:  Diagnosis Date  . Genital warts     Past Surgical History:  Procedure Laterality Date  . BREAST LUMPECTOMY WITH RADIOACTIVE SEED LOCALIZATION Right 07/16/2015   Procedure: RIGHT BREAST LUMPECTOMY WITH RADIOACTIVE SEED LOCALIZATION;  Surgeon: Autumn Messing III, MD;  Location: [facility];  Service: General;  Laterality: Right;  . TONSILLECTOMY    . WISDOM TOOTH EXTRACTION      No current outpatient medications on file.   No current facility-administered medications for this visit.    Family History  Problem Relation Age of Onset  . Breast cancer Mother 62  . Cancer Father   . Cancer Sister        lung  . Cancer Brother        esophageal  Father with pancreatic cancer ~79-80 Brother smoked cigars. She had 4 siblings, 3 sister, one brother (desceased)   Review of Systems  All other systems reviewed and are negative.   Exam:   BP 116/72   Pulse (!) 47   Ht 4' 11.25" (1.505 m)   Wt 132 lb 9.6 oz (60.1 kg)   SpO2  100%   BMI 26.56 kg/m   Weight change: @WEIGHTCHANGE @ Height:   Height: 4' 11.25" (150.5 cm)  Ht Readings from Last 3 Encounters:  01/15/20 4' 11.25" (1.505 m)  07/16/15 5' (1.524 m)    General appearance: alert, cooperative and appears stated age Head: Normocephalic, without obvious abnormality, atraumatic Neck: no adenopathy, supple, symmetrical, trachea midline and thyroid thyroid nodule on the right ~1 cm Lungs: clear to auscultation bilaterally Cardiovascular: regular rate and rhythm Breasts: normal appearance, no masses or tenderness Abdomen: soft, non-tender; non distended,  no masses,  no organomegaly Extremities: extremities normal, atraumatic, no cyanosis or edema Skin: Skin color, texture, turgor normal. No rashes or lesions Lymph nodes: Cervical, supraclavicular, and axillary nodes normal. No abnormal inguinal nodes palpated Neurologic: Grossly normal   Pelvic: External genitalia:  no lesions, normal perineum, she has an anal tag.               Urethra:  normal appearing urethra with no masses, tenderness or lesions              Bartholins and Skenes: normal                 Vagina: normal appearing vagina with normal color  and discharge, no lesions              Cervix: no lesions               Bimanual Exam:  Uterus:  normal size, contour, position, consistency, mobility, non-tender and retroflexed              Adnexa: no mass, fullness, tenderness               Rectovaginal: Confirms               Anus:  normal sphincter tone, no lesions  Karmen Bongo chaperoned for the exam.  A:  Well Woman with normal exam  Thyroid nodule on right, patient reports negative biospy  P:   Pap with hpv  Screening labs (not fasting), TSH  Mammogram due, # given  Discussed breast self exam  Discussed calcium and vit D intake

## 2020-01-15 NOTE — Patient Instructions (Signed)

## 2020-01-16 ENCOUNTER — Other Ambulatory Visit (HOSPITAL_COMMUNITY)
Admission: RE | Admit: 2020-01-16 | Discharge: 2020-01-16 | Disposition: A | Discharge status: Home / Self Care | Payer: 59 | Source: Ambulatory Visit | Attending: Obstetrics and Gynecology | Admitting: Obstetrics and Gynecology

## 2020-01-16 DIAGNOSIS — Z124 Encounter for screening for malignant neoplasm of cervix: Secondary | ICD-10-CM | POA: Insufficient documentation

## 2020-01-16 LAB — CBC
Hematocrit: 43.9 % (ref 34.0–46.6)
Hemoglobin: 14.6 g/dL (ref 11.1–15.9)
MCH: 30.2 pg (ref 26.6–33.0)
MCHC: 33.3 g/dL (ref 31.5–35.7)
MCV: 91 fL (ref 79–97)
Platelets: 212 10*3/uL (ref 150–450)
RBC: 4.84 x10E6/uL (ref 3.77–5.28)
RDW: 12.5 % (ref 11.7–15.4)
WBC: 5.4 10*3/uL (ref 3.4–10.8)

## 2020-01-16 LAB — COMPREHENSIVE METABOLIC PANEL
ALT: 9 IU/L (ref 0–32)
AST: 18 IU/L (ref 0–40)
Albumin/Globulin Ratio: 2.1 (ref 1.2–2.2)
Albumin: 4.6 g/dL (ref 3.8–4.8)
Alkaline Phosphatase: 61 IU/L (ref 44–121)
BUN/Creatinine Ratio: 15 (ref 12–28)
BUN: 11 mg/dL (ref 8–27)
Bilirubin Total: 0.3 mg/dL (ref 0.0–1.2)
CO2: 26 mmol/L (ref 20–29)
Calcium: 9.5 mg/dL (ref 8.7–10.3)
Chloride: 104 mmol/L (ref 96–106)
Creatinine, Ser: 0.75 mg/dL (ref 0.57–1.00)
GFR calc Af Amer: 98 mL/min/{1.73_m2} (ref >59)
GFR calc non Af Amer: 85 mL/min/{1.73_m2} (ref >59)
Globulin, Total: 2.2 g/dL (ref 1.5–4.5)
Glucose: 71 mg/dL (ref 65–99)
Potassium: 4.1 mmol/L (ref 3.5–5.2)
Sodium: 143 mmol/L (ref 134–144)
Total Protein: 6.8 g/dL (ref 6.0–8.5)

## 2020-01-16 LAB — LIPID PANEL
Chol/HDL Ratio: 2.7 ratio (ref 0.0–4.4)
Cholesterol, Total: 212 mg/dL — ABNORMAL HIGH (ref 100–199)
HDL: 79 mg/dL (ref >39)
LDL Chol Calc (NIH): 118 mg/dL — ABNORMAL HIGH (ref 0–99)
Triglycerides: 85 mg/dL (ref 0–149)
VLDL Cholesterol Cal: 15 mg/dL (ref 5–40)

## 2020-01-16 LAB — TSH: TSH: 3.36 u[IU]/mL (ref 0.450–4.500)

## 2020-01-16 NOTE — Addendum Note (Signed)
Addended by: Dorothy Spark on: 01/16/2020 04:32 PM   Modules accepted: Orders

## 2020-01-17 ENCOUNTER — Other Ambulatory Visit: Payer: Self-pay | Admitting: Obstetrics and Gynecology

## 2020-01-17 DIAGNOSIS — Z1231 Encounter for screening mammogram for malignant neoplasm of breast: Secondary | ICD-10-CM

## 2020-01-21 LAB — CYTOLOGY - PAP
Comment: NEGATIVE
Diagnosis: NEGATIVE
High risk HPV: NEGATIVE

## 2020-02-28 ENCOUNTER — Other Ambulatory Visit: Payer: Self-pay

## 2020-02-28 ENCOUNTER — Ambulatory Visit
Admission: RE | Admit: 2020-02-28 | Discharge: 2020-02-28 | Disposition: A | Discharge status: Home / Self Care | Payer: 59 | Source: Ambulatory Visit | Attending: Obstetrics and Gynecology | Admitting: Obstetrics and Gynecology

## 2020-02-28 DIAGNOSIS — Z1231 Encounter for screening mammogram for malignant neoplasm of breast: Secondary | ICD-10-CM

## 2020-03-06 ENCOUNTER — Other Ambulatory Visit: Payer: Self-pay

## 2020-03-06 ENCOUNTER — Ambulatory Visit (AMBULATORY_SURGERY_CENTER): Payer: Self-pay | Admitting: *Deleted

## 2020-03-06 VITALS — Ht 59.25 in | Wt 133.0 lb

## 2020-03-06 DIAGNOSIS — Z1211 Encounter for screening for malignant neoplasm of colon: Secondary | ICD-10-CM

## 2020-03-06 NOTE — Progress Notes (Signed)

## 2020-03-20 ENCOUNTER — Other Ambulatory Visit: Payer: Self-pay

## 2020-03-20 ENCOUNTER — Encounter: Payer: Self-pay | Admitting: Internal Medicine

## 2020-03-20 ENCOUNTER — Ambulatory Visit (AMBULATORY_SURGERY_CENTER): Payer: 59 | Admitting: Internal Medicine

## 2020-03-20 VITALS — BP 98/69 | HR 50 | Temp 97.9°F | Resp 15 | Ht 59.25 in | Wt 133.0 lb

## 2020-03-20 DIAGNOSIS — Z860101 Personal history of adenomatous and serrated colon polyps: Secondary | ICD-10-CM

## 2020-03-20 DIAGNOSIS — Z1211 Encounter for screening for malignant neoplasm of colon: Secondary | ICD-10-CM

## 2020-03-20 DIAGNOSIS — D128 Benign neoplasm of rectum: Secondary | ICD-10-CM | POA: Diagnosis not present

## 2020-03-20 DIAGNOSIS — Z86010 Personal history of colonic polyps: Secondary | ICD-10-CM

## 2020-03-20 HISTORY — PX: COLONOSCOPY: SHX174

## 2020-03-20 HISTORY — DX: Personal history of colonic polyps: Z86.010

## 2020-03-20 HISTORY — DX: Personal history of adenomatous and serrated colon polyps: Z86.0101

## 2020-03-20 MED ORDER — SODIUM CHLORIDE 0.9 % IV SOLN: 500.0000 mL | Freq: Once | INTRAVENOUS | Status: DC

## 2020-03-20 NOTE — Progress Notes (Signed)
Pt's states no medical or surgical changes since previsit or office visit. 

## 2020-03-20 NOTE — Progress Notes (Signed)
VS taken by C.W. 

## 2020-03-20 NOTE — Progress Notes (Signed)
Called to room to assist during endoscopic procedure.  Patient ID and intended procedure confirmed with present staff. Received instructions for my participation in the procedure from the performing physician.  

## 2020-03-20 NOTE — Patient Instructions (Addendum)
I found and removed one tiny rectal polyp. It looks benign, it may be pre-cancerous. Do not worry about that - a common finding and it is removed.  I will let you know pathology results and when to have another routine colonoscopy by mail and/or My Chart.  I appreciate the opportunity to care for you. Gatha Mayer, MD, FACG  YOU HAD AN ENDOSCOPIC PROCEDURE TODAY AT [facility]:   Refer to the procedure report that was given to you for any specific questions about what was found during the examination.  If the procedure report does not answer your questions, please call your gastroenterologist to clarify.  If you requested that your care partner not be given the details of your procedure findings, then the procedure report has been included in a sealed envelope for you to review at your convenience later.  YOU SHOULD EXPECT: Some feelings of bloating in the abdomen. Passage of more gas than usual.  Walking can help get rid of the air that was put into your GI tract during the procedure and reduce the bloating. If you had a lower endoscopy (such as a colonoscopy or flexible sigmoidoscopy) you may notice spotting of blood in your stool or on the toilet paper. If you underwent a bowel prep for your procedure, you may not have a normal bowel movement for a few days.  Please Note:  You might notice some irritation and congestion in your nose or some drainage.  This is from the oxygen used during your procedure.  There is no need for concern and it should clear up in a day or so.  SYMPTOMS TO REPORT IMMEDIATELY:   Following lower endoscopy (colonoscopy or flexible sigmoidoscopy):  Excessive amounts of blood in the stool  Significant tenderness or worsening of abdominal pains  Swelling of the abdomen that is new, acute  Fever of 100F or higher  For urgent or emergent issues, a gastroenterologist can be reached at any hour by calling [phone]. Do not use MyChart messaging for  urgent concerns.    DIET:  We do recommend a small meal at first, but then you may proceed to your regular diet.  Drink plenty of fluids but you should avoid alcoholic beverages for 24 hours.  ACTIVITY:  You should plan to take it easy for the rest of today and you should NOT DRIVE or use heavy machinery until tomorrow (because of the sedation medicines used during the test).    FOLLOW UP: Our staff will call the number listed on your records 48-72 hours following your procedure to check on you and address any questions or concerns that you may have regarding the information given to you following your procedure. If we do not reach you, we will leave a message.  We will attempt to reach you two times.  During this call, we will ask if you have developed any symptoms of COVID 19. If you develop any symptoms (ie: fever, flu-like symptoms, shortness of breath, cough etc.) before then, please call [phone].  If you test positive for Covid 19 in the 2 weeks post procedure, please call and report this information to [place].    If any biopsies were taken you will be contacted by phone or by letter within the next 1-3 weeks.  Please call us at [phone] if you have not heard about the biopsies in 3 weeks.    SIGNATURES/CONFIDENTIALITY: You and/or your care partner have signed paperwork which will be entered  into your electronic medical record.  These signatures attest to the fact that that the information above on your After Visit Summary has been reviewed and is understood.  Full responsibility of the confidentiality of this discharge information lies with you and/or your care-partner.

## 2020-03-20 NOTE — Progress Notes (Signed)
Report given to PACU, vss 

## 2020-03-20 NOTE — Op Note (Addendum)
[facility] Patient Name: Christina Gross Procedure Date: 03/20/2020 1:20 PM MRN: [id number] Endoscopist: Gatha Mayer , MD Age: 65 Referring MD:  Date of Birth: Jun 26, 1955 Gender: Female Account #: [id number] Procedure:                Colonoscopy Indications:              Screening for colorectal malignant neoplasm Medicines:                Propofol per Anesthesia, Monitored Anesthesia Care Procedure:                Pre-Anesthesia Assessment:                           - Prior to the procedure, a History and Physical                            was performed, and patient medications and                            allergies were reviewed. The patient's tolerance of                            previous anesthesia was also reviewed. The risks                            and benefits of the procedure and the sedation                            options and risks were discussed with the patient.                            All questions were answered, and informed consent                            was obtained. Prior Anticoagulants: The patient has                            taken no previous anticoagulant or antiplatelet                            agents. ASA Grade Assessment: I - A normal, healthy                            patient. After reviewing the risks and benefits,                            the patient was deemed in satisfactory condition to                            undergo the procedure.                           After obtaining informed consent, the colonoscope  was passed under direct vision. Throughout the                            procedure, the patient's blood pressure, pulse, and                            oxygen saturations were monitored continuously. The                            Olympus CF-HQ190L ([id number]) Colonoscope was                            introduced through the anus and advanced to the the                             cecum, identified by appendiceal orifice and                            ileocecal valve. The colonoscopy was performed                            without difficulty. The patient tolerated the                            procedure well. The quality of the bowel                            preparation was good. The ileocecal valve,                            appendiceal orifice, and rectum were photographed. Scope In: 1:30:08 PM Scope Out: 1:42:43 PM Scope Withdrawal Time: 0 hours 8 minutes 53 seconds  Total Procedure Duration: 0 hours 12 minutes 35 seconds  Findings:                 The perianal and digital rectal examinations were                            normal.                           A diminutive polyp was found in the rectum. The                            polyp was sessile. The polyp was removed with a                            cold snare. Resection and retrieval were complete.                            Verification of patient identification for the                            specimen was done. Estimated blood loss was minimal.  The exam was otherwise without abnormality on                            direct and retroflexion views. Complications:            No immediate complications. Estimated Blood Loss:     Estimated blood loss was minimal. Impression:               - One diminutive polyp in the rectum, removed with                            a cold snare. Resected and retrieved.                           - The examination was otherwise normal on direct                            and retroflexion views. Recommendation:           - Patient has a contact number available for                            emergencies. The signs and symptoms of potential                            delayed complications were discussed with the                            patient. Return to normal activities tomorrow.                            Written discharge instructions  were provided to the                            patient.                           - Resume previous diet.                           - Continue present medications.                           - Repeat colonoscopy is recommended. The                            colonoscopy date will be determined after pathology                            results from today's exam become available for                            review.                           ADDENDUM - PATIENT HAD SOME RECTAL BLEEDING W/  DEFECATION IN RECOVERY-I EXPLAINED THIS IS LIKELY                            POST RECTAL POLYPECTOMY AND MAY RECUR BUT UNLIKELY                            TO BE A PROBLEM Gatha Mayer, MD 03/20/2020 1:53:25 PM This report has been signed electronically.

## 2020-03-24 ENCOUNTER — Telehealth: Payer: Self-pay | Admitting: *Deleted

## 2020-03-24 ENCOUNTER — Telehealth: Payer: Self-pay

## 2020-03-24 NOTE — Telephone Encounter (Signed)
Second post procedure follow up call, no answer 

## 2020-03-24 NOTE — Telephone Encounter (Signed)
Follow up call made. 

## 2020-04-02 ENCOUNTER — Encounter: Payer: Self-pay | Admitting: Internal Medicine

## 2020-04-02 DIAGNOSIS — Z86010 Personal history of colonic polyps: Secondary | ICD-10-CM

## 2021-02-02 NOTE — Progress Notes (Signed)
67 y.o. [id number] Married White or Caucasian Not Hispanic or Latino female here for annual exam.  No vaginal bleeding. Not sexually active.    No bowel or bladder c/o.   No LMP recorded. Patient is postmenopausal.          Sexually active: No.  The current method of family planning is post menopausal status.    Exercising: Yes.     Yoga and walking  Smoker:  no  Health Maintenance: Pap:  01/16/20 wnl hr hpv neg.  History of abnormal Pap:  no MMG:  02/29/20 density B Bi-rads 1 neg  BMD:   none  Colonoscopy: 03/20/20, polyp, f/u 7 years  TDaP:  09/07/20  Gardasil: n/a   reports that she has never smoked. She has never used smokeless tobacco. She reports current alcohol use. She reports that she does not use drugs. She has 2 kids, 4 grandchildren (range from 69-80 years old). Retired. Daughter lives locally with her 2 sons. [place] and her husband take care of her 2 grandsons. Son and his 2 daughters are in [place].  Married x 40 years, very close.   Past Medical History:  Diagnosis Date   Genital warts    Hx of adenomatous polyp of colon 03/20/2020    Past Surgical History:  Procedure Laterality Date   BREAST BIOPSY Right 06/04/2015   Intraductal papilloma   BREAST EXCISIONAL BIOPSY Right 07/16/2015   Intraductal papilloma   BREAST LUMPECTOMY WITH RADIOACTIVE SEED LOCALIZATION Right 07/16/2015   Procedure: RIGHT BREAST LUMPECTOMY WITH RADIOACTIVE SEED LOCALIZATION;  Surgeon: Autumn Messing III, MD;  Location: [facility];  Service: General;  Laterality: Right;   COLONOSCOPY  03/20/2020   Most recent had one 10+ years prior also   THYROID CYST EXCISION     benign   TONSILLECTOMY     WISDOM TOOTH EXTRACTION      No current outpatient medications on file.   No current facility-administered medications for this visit.    Family History  Problem Relation Age of Onset   Breast cancer Mother 81   Cancer Father    Cancer Sister        lung   Cancer Brother         esophageal   Esophageal cancer Brother    Colon cancer Neg Hx    Colon polyps Neg Hx    Rectal cancer Neg Hx    Stomach cancer Neg Hx     Review of Systems  All other systems reviewed and are negative.  Exam:   BP 100/62    Pulse 61    Ht 5' (1.524 m)    Wt 133 lb (60.3 kg)    SpO2 99%    BMI 25.97 kg/m   Weight change: @WEIGHTCHANGE @ Height:   Height: 5' (152.4 cm)  Ht Readings from Last 3 Encounters:  02/04/21 5' (1.524 m)  03/20/20 4' 11.25" (1.505 m)  03/06/20 4' 11.25" (1.505 m)    General appearance: alert, cooperative and appears stated age Head: Normocephalic, without obvious abnormality, atraumatic Neck: no adenopathy, supple, symmetrical, trachea midline and thyroid  right lobe>left (known nodule, no change in size, prior negative biopsy) Lungs: clear to auscultation bilaterally Cardiovascular: regular rate and rhythm Breasts: normal appearance, no masses or tenderness Abdomen: soft, non-tender; non distended,  no masses,  no organomegaly Extremities: extremities normal, atraumatic, no cyanosis or edema Skin: Skin color, texture, turgor normal. No rashes or lesions Lymph nodes: Cervical, supraclavicular, and axillary nodes normal. No  abnormal inguinal nodes palpated Neurologic: Grossly normal   Pelvic: External genitalia:  no lesions              Urethra:  normal appearing urethra with no masses, tenderness or lesions              Bartholins and Skenes: normal                 Vagina: mildly atrophic appearing vagina with normal color and discharge, no lesions              Cervix: no lesions               Bimanual Exam:  Uterus:  normal size, contour, position, consistency, mobility, non-tender and retroverted              Adnexa: no mass, fullness, tenderness               Rectovaginal: Confirms               Anus:  normal sphincter tone, no lesions  Gae Dry chaperoned for the exam.  1. Well woman exam Pap UTD Mammogram due later this month Colonoscopy  UTD DEXA due next year Discussed breast self exam Discussed calcium and vit D intake  2. Laboratory exam ordered as part of routine general medical examination - CBC - Comprehensive metabolic panel - Lipid panel  3. Elevated LDL cholesterol level - Lipid panel

## 2021-02-04 ENCOUNTER — Encounter: Payer: Self-pay | Admitting: Obstetrics and Gynecology

## 2021-02-04 ENCOUNTER — Ambulatory Visit (INDEPENDENT_AMBULATORY_CARE_PROVIDER_SITE_OTHER): Payer: Managed Care, Other (non HMO) | Admitting: Obstetrics and Gynecology

## 2021-02-04 ENCOUNTER — Other Ambulatory Visit: Payer: Self-pay

## 2021-02-04 VITALS — BP 100/62 | HR 61 | Ht 60.0 in | Wt 133.0 lb

## 2021-02-04 DIAGNOSIS — Z01419 Encounter for gynecological examination (general) (routine) without abnormal findings: Secondary | ICD-10-CM | POA: Diagnosis not present

## 2021-02-04 DIAGNOSIS — Z0000 Encounter for general adult medical examination without abnormal findings: Secondary | ICD-10-CM

## 2021-02-04 DIAGNOSIS — E7800 Pure hypercholesterolemia, unspecified: Secondary | ICD-10-CM | POA: Diagnosis not present

## 2021-02-04 DIAGNOSIS — D2239 Melanocytic nevi of other parts of face: Secondary | ICD-10-CM | POA: Insufficient documentation

## 2021-02-04 LAB — CBC
HCT: 42 % (ref 35.0–45.0)
Hemoglobin: 14 g/dL (ref 11.7–15.5)
MCH: 30.4 pg (ref 27.0–33.0)
MCHC: 33.3 g/dL (ref 32.0–36.0)
MCV: 91.1 fL (ref 80.0–100.0)
MPV: 9.6 fL (ref 7.5–12.5)
Platelets: 177 10*3/uL (ref 140–400)
RBC: 4.61 10*6/uL (ref 3.80–5.10)
RDW: 12.7 % (ref 11.0–15.0)
WBC: 3.2 10*3/uL — ABNORMAL LOW (ref 3.8–10.8)

## 2021-02-04 LAB — COMPREHENSIVE METABOLIC PANEL
AG Ratio: 1.7 (calc) (ref 1.0–2.5)
ALT: 13 U/L (ref 6–29)
AST: 21 U/L (ref 10–35)
Albumin: 4 g/dL (ref 3.6–5.1)
Alkaline phosphatase (APISO): 59 U/L (ref 37–153)
BUN: 15 mg/dL (ref 7–25)
CO2: 29 mmol/L (ref 20–32)
Calcium: 8.8 mg/dL (ref 8.6–10.4)
Chloride: 107 mmol/L (ref 98–110)
Creat: 0.63 mg/dL (ref 0.50–1.05)
Globulin: 2.4 g/dL (calc) (ref 1.9–3.7)
Glucose, Bld: 78 mg/dL (ref 65–99)
Potassium: 3.9 mmol/L (ref 3.5–5.3)
Sodium: 142 mmol/L (ref 135–146)
Total Bilirubin: 0.3 mg/dL (ref 0.2–1.2)
Total Protein: 6.4 g/dL (ref 6.1–8.1)

## 2021-02-04 LAB — LIPID PANEL
Cholesterol: 176 mg/dL (ref <200)
HDL: 56 mg/dL (ref >=50)
LDL Cholesterol (Calc): 102 mg/dL (calc) — ABNORMAL HIGH
Non-HDL Cholesterol (Calc): 120 mg/dL (calc) (ref <130)
Total CHOL/HDL Ratio: 3.1 (calc) (ref <5.0)
Triglycerides: 90 mg/dL (ref <150)

## 2021-02-04 NOTE — Patient Instructions (Signed)

## 2021-02-08 ENCOUNTER — Other Ambulatory Visit: Payer: Self-pay | Admitting: *Deleted

## 2021-02-08 DIAGNOSIS — D729 Disorder of white blood cells, unspecified: Secondary | ICD-10-CM

## 2021-02-22 ENCOUNTER — Other Ambulatory Visit: Payer: Self-pay

## 2021-02-22 ENCOUNTER — Other Ambulatory Visit: Payer: Managed Care, Other (non HMO)

## 2021-02-22 DIAGNOSIS — D729 Disorder of white blood cells, unspecified: Secondary | ICD-10-CM

## 2021-02-22 LAB — CBC WITH DIFFERENTIAL/PLATELET
Absolute Monocytes: 320 cells/uL (ref 200–950)
Basophils Absolute: 38 cells/uL (ref 0–200)
Basophils Relative: 0.8 %
Eosinophils Absolute: 118 cells/uL (ref 15–500)
Eosinophils Relative: 2.5 %
HCT: 41.8 % (ref 35.0–45.0)
Hemoglobin: 14 g/dL (ref 11.7–15.5)
Lymphs Abs: 1189 cells/uL (ref 850–3900)
MCH: 30.2 pg (ref 27.0–33.0)
MCHC: 33.5 g/dL (ref 32.0–36.0)
MCV: 90.1 fL (ref 80.0–100.0)
MPV: 9.6 fL (ref 7.5–12.5)
Monocytes Relative: 6.8 %
Neutro Abs: 3036 cells/uL (ref 1500–7800)
Neutrophils Relative %: 64.6 %
Platelets: 210 10*3/uL (ref 140–400)
RBC: 4.64 10*6/uL (ref 3.80–5.10)
RDW: 12.6 % (ref 11.0–15.0)
Total Lymphocyte: 25.3 %
WBC: 4.7 10*3/uL (ref 3.8–10.8)

## 2021-03-25 ENCOUNTER — Encounter: Payer: Self-pay | Admitting: Obstetrics and Gynecology

## 2021-03-25 ENCOUNTER — Ambulatory Visit: Payer: Managed Care, Other (non HMO) | Admitting: Obstetrics and Gynecology

## 2021-03-25 ENCOUNTER — Other Ambulatory Visit: Payer: Self-pay

## 2021-03-25 VITALS — BP 132/80 | HR 73 | Ht 60.0 in | Wt 135.0 lb

## 2021-03-25 DIAGNOSIS — N952 Postmenopausal atrophic vaginitis: Secondary | ICD-10-CM

## 2021-03-25 DIAGNOSIS — N761 Subacute and chronic vaginitis: Secondary | ICD-10-CM | POA: Diagnosis not present

## 2021-03-25 DIAGNOSIS — N898 Other specified noninflammatory disorders of vagina: Secondary | ICD-10-CM | POA: Diagnosis not present

## 2021-03-25 LAB — WET PREP FOR TRICH, YEAST, CLUE

## 2021-03-25 MED ORDER — CLINDAMYCIN PHOSPHATE 2 % VA CREA: 1.0000 | TOPICAL_CREAM | Freq: Every day | VAGINAL | 0 refills | Status: DC

## 2021-03-25 NOTE — Progress Notes (Signed)
GYNECOLOGY  VISIT   HPI: 66 y.o.   Married White or Caucasian Not Hispanic or Latino  female   [phone] with No LMP recorded. Patient is postmenopausal.   here for clear vaginal discharge that seems to happen more at night.  The D/C is watery, no odor, not urine.  Last night she felt a little irritated and itchy.  Not sexually active.   GYNECOLOGIC HISTORY: No LMP recorded. Patient is postmenopausal. 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   Hx of adenomatous polyp of colon 03/20/2020    Past Medical History:  Diagnosis Date   Genital warts    Hx of adenomatous polyp of colon 03/20/2020    Past Surgical History:  Procedure Laterality Date   BREAST BIOPSY Right 06/04/2015   Intraductal papilloma   BREAST EXCISIONAL BIOPSY Right 07/16/2015   Intraductal papilloma   BREAST LUMPECTOMY WITH RADIOACTIVE SEED LOCALIZATION Right 07/16/2015   Procedure: RIGHT BREAST LUMPECTOMY WITH RADIOACTIVE SEED LOCALIZATION;  Surgeon: Autumn Messing III, MD;  Location: [facility];  Service: General;  Laterality: Right;   COLONOSCOPY  03/20/2020   Most recent had one 10+ years prior also   THYROID CYST EXCISION     benign   TONSILLECTOMY     WISDOM TOOTH EXTRACTION      No current outpatient medications on file.   No current facility-administered medications for this visit.     ALLERGIES: Patient has no known allergies.  Family History  Problem Relation Age of Onset   Breast cancer Mother 26   Cancer Father    Cancer Sister        lung   Cancer Brother        esophageal   Esophageal cancer Brother    Colon cancer Neg Hx    Colon polyps Neg Hx    Rectal cancer Neg Hx    Stomach cancer  Neg Hx     Social History   Socioeconomic History   Marital status: Married    Spouse name: Not on file   Number of children: Not on file   Years of education: Not on file   Highest education level: Not on file  Occupational History   Not on file  Tobacco Use   Smoking status: Never   Smokeless tobacco: Never  Vaping Use   Vaping Use: Never used  Substance and Sexual Activity   Alcohol use: Yes    Comment: occas   Drug use: No   Sexual activity: Not Currently    Partners: Male    Birth control/protection: Post-menopausal  Other Topics Concern   Not on file  Social History Narrative   Not on file   Social Determinants of Health   Financial Resource Strain: Not on file  Food Insecurity: Not on file  Transportation Needs: Not on file  Physical Activity: Not on file  Stress: Not on  file  Social Connections: Not on file  Intimate Partner Violence: Not on file    Review of Systems  Genitourinary:        Vaginal discharge   All other systems reviewed and are negative.  PHYSICAL EXAMINATION:    BP 132/80    Pulse 73    Ht 5' (1.524 m)    Wt 135 lb (61.2 kg)    SpO2 99%    BMI 26.37 kg/m     General appearance: alert, cooperative and appears stated age  Pelvic: External genitalia:  no lesions              Urethra:  normal appearing urethra with no masses, tenderness or lesions              Bartholins and Skenes: normal                 Vagina: very erythematous, atrophic appearing vagina with a large amount of clear vaginal discharge (slight yellow tinge)              Cervix: no lesions  BM: no masses or tenderness              Chaperone was present for exam.  1. Vaginal discharge - WET PREP FOR TRICH, YEAST, CLUE: no yeast, BV or trich. ~15 WBC per epithelial cell. C/W DIV. - SureSwab Advanced Vaginitis, TMA  2. Desquamative inflammatory vaginitis Discussed. Will send of sureswab.  - SureSwab Advanced Vaginitis, TMA - clindamycin (CLEOCIN) 2 % vaginal  cream; Place 1 Applicatorful vaginally at bedtime. Use nightly for 4 weeks.  Dispense: 160 g; Refill: 0  3. Vaginal atrophy Discussed possible future treatment of atrophy if having recurrent DIV

## 2021-03-26 LAB — SURESWAB® ADVANCED VAGINITIS,TMA
CANDIDA SPECIES: NOT DETECTED
Candida glabrata: NOT DETECTED
SURESWAB(R) ADV BACTERIAL VAGINOSIS(BV),TMA: NEGATIVE
TRICHOMONAS VAGINALIS (TV),TMA: NOT DETECTED

## 2021-04-29 ENCOUNTER — Ambulatory Visit: Payer: Managed Care, Other (non HMO) | Admitting: Obstetrics and Gynecology

## 2021-04-29 ENCOUNTER — Encounter: Payer: Self-pay | Admitting: Obstetrics and Gynecology

## 2021-04-29 VITALS — BP 110/64 | HR 77 | Wt 133.0 lb

## 2021-04-29 DIAGNOSIS — N952 Postmenopausal atrophic vaginitis: Secondary | ICD-10-CM

## 2021-04-29 DIAGNOSIS — Z8742 Personal history of other diseases of the female genital tract: Secondary | ICD-10-CM | POA: Diagnosis not present

## 2021-04-29 LAB — WET PREP FOR TRICH, YEAST, CLUE

## 2021-04-29 NOTE — Progress Notes (Signed)
GYNECOLOGY  VISIT ?  ?HPI: ?66 y.o.   Married White or Caucasian Not Hispanic or Latino  female   ?[id number] with No LMP recorded. Patient is postmenopausal.   ?here for follow up s/p treatment for DIV with clindamycin. She used the clindamycin vaginally x 4 weeks. She states that the discharge is gone.  ? ?GYNECOLOGIC HISTORY: ?No LMP recorded. Patient is postmenopausal. ?Contraception:none  ?Menopausal hormone therapy: none  ?       ?OB History   ? ? Gravida  ?2  ? Para  ?2  ? Term  ?2  ? Preterm  ?   ? AB  ?   ? Living  ?2  ?  ? ? SAB  ?   ? IAB  ?   ? Ectopic  ?   ? Multiple  ?   ? Live Births  ?2  ?   ?  ?  ?    ? ?Patient Active Problem List  ? Diagnosis Date Noted  ? Nevus of eyebrow 02/04/2021  ? Hx of adenomatous polyp of colon 03/20/2020  ? ? ?Past Medical History:  ?Diagnosis Date  ? Genital warts   ? Hx of adenomatous polyp of colon 03/20/2020  ? ? ?Past Surgical History:  ?Procedure Laterality Date  ? BREAST BIOPSY Right 06/04/2015  ? Intraductal papilloma  ? BREAST EXCISIONAL BIOPSY Right 07/16/2015  ? Intraductal papilloma  ? BREAST LUMPECTOMY WITH RADIOACTIVE SEED LOCALIZATION Right 07/16/2015  ? Procedure: RIGHT BREAST LUMPECTOMY WITH RADIOACTIVE SEED LOCALIZATION;  Surgeon: Autumn Messing III, MD;  Location: [facility];  Service: General;  Laterality: Right;  ? COLONOSCOPY  03/20/2020  ? Most recent had one 10+ years prior also  ? THYROID CYST EXCISION    ? benign  ? TONSILLECTOMY    ? WISDOM TOOTH EXTRACTION    ? ? ?No current outpatient medications on file.  ? ?No current facility-administered medications for this visit.  ?  ? ?ALLERGIES: Patient has no known allergies. ? ?Family History  ?Problem Relation Age of Onset  ? Breast cancer Mother 65  ? Cancer Father   ? Cancer Sister   ?     lung  ? Cancer Brother   ?     esophageal  ? Esophageal cancer Brother   ? Colon cancer Neg Hx   ? Colon polyps Neg Hx   ? Rectal cancer Neg Hx   ? Stomach cancer Neg Hx   ? ? ?Social History   ? ?Socioeconomic History  ? Marital status: Married  ?  Spouse name: Not on file  ? Number of children: Not on file  ? Years of education: Not on file  ? Highest education level: Not on file  ?Occupational History  ? Not on file  ?Tobacco Use  ? Smoking status: Never  ? Smokeless tobacco: Never  ?Vaping Use  ? Vaping Use: Never used  ?Substance and Sexual Activity  ? Alcohol use: Yes  ?  Comment: occas  ? Drug use: No  ? Sexual activity: Not Currently  ?  Partners: Male  ?  Birth control/protection: Post-menopausal  ?Other Topics Concern  ? Not on file  ?Social History Narrative  ? Not on file  ? ?Social Determinants of Health  ? ?Financial Resource Strain: Not on file  ?Food Insecurity: Not on file  ?Transportation Needs: Not on file  ?Physical Activity: Not on file  ?Stress: Not on file  ?Social Connections: Not on file  ?Intimate Partner  Violence: Not on file  ? ? ?ROS ? ?PHYSICAL EXAMINATION:   ? ?BP 110/64   Pulse 77   Wt 133 lb (60.3 kg)   SpO2 99%   BMI 25.97 kg/m?     ?General appearance: alert, cooperative and appears stated age ?Pelvic: External genitalia:  no lesions ?             Urethra:  normal appearing urethra with no masses, tenderness or lesions ?             Bartholins and Skenes: normal    ?             Vagina: atrophic appearing vagina without discharge, no lesions ?             Cervix: no lesions ?              ?Chaperone was present for exam. ? ?1. History of vaginitis ?H/O DIV, s/p treatment with Clindamycin ?- WET PREP FOR TRICH, YEAST, CLUE: negative. I looked at the slides myself. Normal amount of WBC, not >1 WBC per 1 epithelial cell ?-call with recurrent symptoms ? ?2. Vaginal atrophy ?Not symptomatic. We discussed possible treatment if she were to develop recurrent DIV. ? ? ? ?

## 2021-07-21 ENCOUNTER — Encounter: Payer: Self-pay | Admitting: Obstetrics and Gynecology

## 2021-07-21 ENCOUNTER — Ambulatory Visit (HOSPITAL_COMMUNITY)
Admission: EM | Admit: 2021-07-21 | Discharge: 2021-07-21 | Disposition: A | Discharge status: Home / Self Care | Payer: Commercial Managed Care - HMO | Attending: Family Medicine | Admitting: Family Medicine

## 2021-07-21 ENCOUNTER — Encounter (HOSPITAL_COMMUNITY): Payer: Self-pay | Admitting: Emergency Medicine

## 2021-07-21 ENCOUNTER — Ambulatory Visit (INDEPENDENT_AMBULATORY_CARE_PROVIDER_SITE_OTHER): Payer: Commercial Managed Care - HMO | Admitting: Obstetrics and Gynecology

## 2021-07-21 VITALS — BP 122/70 | HR 72 | Ht 60.0 in | Wt 135.0 lb

## 2021-07-21 DIAGNOSIS — B9689 Other specified bacterial agents as the cause of diseases classified elsewhere: Secondary | ICD-10-CM | POA: Diagnosis not present

## 2021-07-21 DIAGNOSIS — N952 Postmenopausal atrophic vaginitis: Secondary | ICD-10-CM | POA: Diagnosis not present

## 2021-07-21 DIAGNOSIS — N898 Other specified noninflammatory disorders of vagina: Secondary | ICD-10-CM

## 2021-07-21 DIAGNOSIS — W57XXXA Bitten or stung by nonvenomous insect and other nonvenomous arthropods, initial encounter: Secondary | ICD-10-CM | POA: Diagnosis not present

## 2021-07-21 DIAGNOSIS — S80861A Insect bite (nonvenomous), right lower leg, initial encounter: Secondary | ICD-10-CM

## 2021-07-21 DIAGNOSIS — L309 Dermatitis, unspecified: Secondary | ICD-10-CM

## 2021-07-21 DIAGNOSIS — L089 Local infection of the skin and subcutaneous tissue, unspecified: Secondary | ICD-10-CM | POA: Diagnosis not present

## 2021-07-21 LAB — WET PREP FOR TRICH, YEAST, CLUE

## 2021-07-21 MED ORDER — MUPIROCIN 2 % EX OINT: 1.0000 | TOPICAL_OINTMENT | Freq: Two times a day (BID) | CUTANEOUS | 0 refills | Status: DC

## 2021-07-21 MED ORDER — BETAMETHASONE VALERATE 0.1 % EX OINT: 1.0000 | TOPICAL_OINTMENT | Freq: Two times a day (BID) | CUTANEOUS | 0 refills | Status: DC

## 2021-07-21 MED ORDER — DOXYCYCLINE HYCLATE 100 MG PO CAPS: 100.0000 mg | ORAL_CAPSULE | Freq: Two times a day (BID) | ORAL | 0 refills | Status: DC

## 2021-07-21 NOTE — ED Triage Notes (Signed)
Patient c/o bug bites on legs x 1 week, no itching, some redness.  Patient has tried Boric Acid powder diluted in water applied to the areas.  There is some drainage.

## 2021-07-21 NOTE — Progress Notes (Signed)
GYNECOLOGY  VISIT   HPI: 66 y.o.   Married White or Caucasian Not Hispanic or Latino  female   [phone] with No LMP recorded. Patient is postmenopausal.   here for vaginal discharge. She had DIV earlier this year which was successfully treated with Clindamycin. She c/o a one week h/o an increase in a watery vaginal d/c, varies in amount. No odor. No irritation, no burning, just has peri-anal burning and irritation.  She has a BM one x a day, soft, uses wipes.  She has some anal buring.   She has two bug bites on her leg.   GYNECOLOGIC HISTORY: No LMP recorded. Patient is postmenopausal. 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   Hx of adenomatous polyp of colon 03/20/2020    Past Medical History:  Diagnosis Date   Genital warts    Hx of adenomatous polyp of colon 03/20/2020    Past Surgical History:  Procedure Laterality Date   BREAST BIOPSY Right 06/04/2015   Intraductal papilloma   BREAST EXCISIONAL BIOPSY Right 07/16/2015   Intraductal papilloma   BREAST LUMPECTOMY WITH RADIOACTIVE SEED LOCALIZATION Right 07/16/2015   Procedure: RIGHT BREAST LUMPECTOMY WITH RADIOACTIVE SEED LOCALIZATION;  Surgeon: Autumn Messing III, MD;  Location: [facility];  Service: General;  Laterality: Right;   COLONOSCOPY  03/20/2020   Most recent had one 10+ years prior also   THYROID CYST EXCISION     benign   TONSILLECTOMY     WISDOM TOOTH EXTRACTION      No current outpatient medications on file.   No current facility-administered medications for this visit.     ALLERGIES: Patient has no known allergies.  Family History  Problem Relation Age of Onset   Breast cancer Mother 59   Cancer Father     Cancer Sister        lung   Cancer Brother        esophageal   Esophageal cancer Brother    Colon cancer Neg Hx    Colon polyps Neg Hx    Rectal cancer Neg Hx    Stomach cancer Neg Hx     Social History   Socioeconomic History   Marital status: Married    Spouse name: Not on file   Number of children: Not on file   Years of education: Not on file   Highest education level: Not on file  Occupational History   Not on file  Tobacco Use   Smoking status: Never   Smokeless tobacco: Never  Vaping Use   Vaping Use: Never used  Substance and Sexual Activity   Alcohol use: Yes    Comment: occas   Drug use: No   Sexual activity: Not Currently    Partners: Male    Birth control/protection: Post-menopausal  Other Topics Concern   Not on file  Social History Narrative   Not on file  Social Determinants of Health   Financial Resource Strain: Not on file  Food Insecurity: Not on file  Transportation Needs: Not on file  Physical Activity: Not on file  Stress: Not on file  Social Connections: Not on file  Intimate Partner Violence: Not on file    Review of Systems  All other systems reviewed and are negative.   PHYSICAL EXAMINATION:    BP 122/70   Pulse 72   Ht 5' (1.524 m)   Wt 135 lb (61.2 kg)   SpO2 100%   BMI 26.37 kg/m     General appearance: alert, cooperative and appears stated age  Pelvic: External genitalia:  no lesions              Urethra:  normal appearing urethra with no masses, tenderness or lesions              Bartholins and Skenes: normal                 Vagina: atrophic appearing vagina with erythema (c/w atrophy), no significant vaginal discharge, no lesions              Anal tag noted, marked perianal erythema  Chaperone was present for exam.  1. Vaginal discharge No significant d/c on exam, she may be getting slight d/c from atophy - WET PREP FOR [place], YEAST, CLUE: negative, no signs of DIV (I personally looked at the slides)  2.  Vaginal atrophy Overall not symptomatic, not sexually active  3. Perianal dermatitis Discussed vulvar and perianal skin care, information given.  - betamethasone valerate ointment (VALISONE) 0.1 %; Apply 1 Application topically 2 (two) times daily. Use for up to 2 weeks as needed  Dispense: 30 g; Refill: 0

## 2021-07-22 NOTE — ED Provider Notes (Signed)
[place]   [id number] 07/21/21 Arrival Time: 0160  ASSESSMENT & PLAN:  1. Localized bacterial skin infection   2. Insect bite of right lower leg, initial encounter    No signs of abscess formation.  Begin: Discharge Medication List as of 07/21/2021  3:11 PM     START taking these medications   Details  doxycycline (VIBRAMYCIN) 100 MG capsule Take 1 capsule (100 mg total) by mouth 2 (two) times daily., Starting Wed 07/21/2021, Normal    mupirocin ointment (BACTROBAN) 2 % Apply 1 Application topically 2 (two) times daily., Starting Wed 07/21/2021, Normal         Follow-up Information     [facility] at [facility]   Specialty: Urgent Care Why: If worsening or failing to improve as anticipated. Contact information: [place] [id number] [phone]                Reviewed expectations re: course of current medical issues. Questions answered. Outlined signs and symptoms indicating need for more acute intervention. Understanding verbalized. After Visit Summary given.   SUBJECTIVE: History from: Patient. Christina Gross is a 66 y.o. female. Reports: questions insect bites to RLE; noted 1 week ago. Continued redness/irritation; no drainage/bleeding. Patient has tried Boric Acid powder diluted in water applied to the areas. Denies: fever. Normal PO intake without n/v/d.  OBJECTIVE:  Vitals:   07/21/21 1500 07/21/21 1502  BP: (!) 144/82   Pulse: (!) 53   Resp: 18   Temp: 97.9 F (36.6 C)   TempSrc: Oral   SpO2: 98%   Weight:  59.4 kg  Height:  5' (1.524 m)    General appearance: alert; no distress Lungs: speaks full sentences without difficulty; unlabored Extremities: no edema Skin: warm and dry; 2-3 approx 1 cm round erythematous indurated areas on RLE; with surrounding erythema and overlying crusting Neurologic: normal gait Psychological: alert and cooperative; normal mood and affect   No  Known Allergies  Past Medical History:  Diagnosis Date   Genital warts    Hx of adenomatous polyp of colon 03/20/2020   Social History   Socioeconomic History   Marital status: Married    Spouse name: Not on file   Number of children: Not on file   Years of education: Not on file   Highest education level: Not on file  Occupational History   Not on file  Tobacco Use   Smoking status: Never   Smokeless tobacco: Never  Vaping Use   Vaping Use: Never used  Substance and Sexual Activity   Alcohol use: Yes    Comment: occas   Drug use: No   Sexual activity: Not Currently    Partners: Male    Birth control/protection: Post-menopausal  Other Topics Concern   Not on file  Social History Narrative   Not on file   Social Determinants of Health   Financial Resource Strain: Not on file  Food Insecurity: Not on file  Transportation Needs: Not on file  Physical Activity: Not on file  Stress: Not on file  Social Connections: Not on file  Intimate Partner Violence: Not on file   Family History  Problem Relation Age of Onset   Breast cancer Mother 2   Cancer Father    Cancer Sister        lung   Cancer Brother        esophageal   Esophageal cancer Brother    Colon cancer Neg Hx  Colon polyps Neg Hx    Rectal cancer Neg Hx    Stomach cancer Neg Hx    Past Surgical History:  Procedure Laterality Date   BREAST BIOPSY Right 06/04/2015   Intraductal papilloma   BREAST EXCISIONAL BIOPSY Right 07/16/2015   Intraductal papilloma   BREAST LUMPECTOMY WITH RADIOACTIVE SEED LOCALIZATION Right 07/16/2015   Procedure: RIGHT BREAST LUMPECTOMY WITH RADIOACTIVE SEED LOCALIZATION;  Surgeon: Autumn Messing III, MD;  Location: [place];  Service: General;  Laterality: Right;   COLONOSCOPY  03/20/2020   Most recent had one 10+ years prior also   THYROID CYST EXCISION     benign   TONSILLECTOMY     WISDOM TOOTH EXTRACTION       Vanessa Kick, MD 07/22/21 [phone]

## 2022-03-03 NOTE — Progress Notes (Signed)
67 y.o. G42P2002 Married White or Caucasian Not Hispanic or Latino female here for annual exam.  No vaginal bleeding. Not sexually active.  No bladder c/o. She has some mild constipation.     No LMP recorded. Patient is postmenopausal.          Sexually active: No.  The current method of family planning is post menopausal status.    Exercising: Yes.     Yoga and walking Smoker:  no  Health Maintenance: Pap:   01/16/20 wnl hr hpv neg.   History of abnormal Pap:  no MMG: 02/29/20 density B Bi-rads 1 neg  BMD:   none  Colonoscopy: 03/20/20, polyp, f/u 7 years  TDaP:  09/07/20  Gardasil: n/a   reports that she has never smoked. She has never used smokeless tobacco. She reports current alcohol use. She reports that she does not use drugs. She has 2 kids, 4 grandchildren (range from 49-2 years old).   Past Medical History:  Diagnosis Date   Genital warts    Hx of adenomatous polyp of colon 03/20/2020    Past Surgical History:  Procedure Laterality Date   BREAST BIOPSY Right 06/04/2015   Intraductal papilloma   BREAST EXCISIONAL BIOPSY Right 07/16/2015   Intraductal papilloma   BREAST LUMPECTOMY WITH RADIOACTIVE SEED LOCALIZATION Right 07/16/2015   Procedure: RIGHT BREAST LUMPECTOMY WITH RADIOACTIVE SEED LOCALIZATION;  Surgeon: Autumn Messing III, MD;  Location: [place];  Service: General;  Laterality: Right;   COLONOSCOPY  03/20/2020   Most recent had one 10+ years prior also   THYROID CYST EXCISION     benign   TONSILLECTOMY     WISDOM TOOTH EXTRACTION      No current outpatient medications on file.   No current facility-administered medications for this visit.    Family History  Problem Relation Age of Onset   Breast cancer Mother 64   Cancer Father    Cancer Sister        lung   Cancer Brother        esophageal   Esophageal cancer Brother    Colon cancer Neg Hx    Colon polyps Neg Hx    Rectal cancer Neg Hx    Stomach cancer Neg Hx     Review of  Systems  All other systems reviewed and are negative.   Exam:   BP 124/78 (BP Location: Right Arm, Patient Position: Sitting, Cuff Size: Normal)   Pulse (!) 57   Ht 4' 11"$  (1.499 m)   Wt 133 lb (60.3 kg)   SpO2 100%   BMI 26.86 kg/m   Weight change: @WEIGHTCHANGE$ @ Height:   Height: 4' 11"$  (149.9 cm)  Ht Readings from Last 3 Encounters:  03/11/22 4' 11"$  (1.499 m)  07/21/21 5' (1.524 m)  07/21/21 5' (1.524 m)    General appearance: alert, cooperative and appears stated age Head: Normocephalic, without obvious abnormality, atraumatic Neck: no adenopathy, supple, symmetrical, trachea midline and thyroid  small right sided nodule noted Breasts: normal appearance, no masses or tenderness Abdomen: soft, non-tender; non distended,  no masses,  no organomegaly Extremities: extremities normal, atraumatic, no cyanosis or edema Skin: Skin color, texture, turgor normal. No rashes or lesions Lymph nodes: Cervical, supraclavicular, and axillary nodes normal. No abnormal inguinal nodes palpated Neurologic: Grossly normal   Pelvic: External genitalia:  no lesions              Urethra:  normal appearing urethra with no masses, tenderness or  lesions              Bartholins and Skenes: normal                 Vagina: artophic appearing vagina with a small grade 2 rectocele              Cervix: no lesions               Bimanual Exam:  Uterus:  normal size, contour, position, consistency, mobility, non-tender              Adnexa: no mass, fullness, tenderness               Rectovaginal: Confirms               Anus:  normal sphincter tone, no lesions  Santiago Glad, CMA chaperoned for the exam.  1. Encounter for breast and pelvic examination Discussed breast self exam Discussed calcium and vit D intake Mammogram due, she will schedule Will establish care with a primary for labs Colonoscopy UTD Pap UTD  2. Hypoestrogenism - DG Bone Density; Future  3. Constipation, unspecified constipation  type Mild. Discussed use of fruit, fiber and fluid. Try magnesium 500 mg a day Can also try metamucil if needed  4. Rectocele Avoid heavy lifting and straining Information given

## 2022-03-11 ENCOUNTER — Encounter: Payer: Self-pay | Admitting: Obstetrics and Gynecology

## 2022-03-11 ENCOUNTER — Ambulatory Visit (INDEPENDENT_AMBULATORY_CARE_PROVIDER_SITE_OTHER): Payer: Medicare Other | Admitting: Obstetrics and Gynecology

## 2022-03-11 VITALS — BP 124/78 | HR 57 | Ht 59.0 in | Wt 133.0 lb

## 2022-03-11 DIAGNOSIS — Z8742 Personal history of other diseases of the female genital tract: Secondary | ICD-10-CM

## 2022-03-11 DIAGNOSIS — K5900 Constipation, unspecified: Secondary | ICD-10-CM | POA: Diagnosis not present

## 2022-03-11 DIAGNOSIS — E2839 Other primary ovarian failure: Secondary | ICD-10-CM | POA: Diagnosis not present

## 2022-03-11 DIAGNOSIS — N816 Rectocele: Secondary | ICD-10-CM

## 2022-03-11 DIAGNOSIS — Z01419 Encounter for gynecological examination (general) (routine) without abnormal findings: Secondary | ICD-10-CM

## 2022-03-11 DIAGNOSIS — Z9189 Other specified personal risk factors, not elsewhere classified: Secondary | ICD-10-CM | POA: Diagnosis not present

## 2022-03-11 NOTE — Patient Instructions (Addendum)
Try magnesium 500 mg daily for constipation  Schedule your bone density and mammogram at the breast center    About Rectocele  Overview  A rectocele is a type of hernia which causes different degrees of bulging of the rectal tissues into the vaginal wall.  You may even notice that it presses against the vaginal wall so much that some vaginal tissues droop outside of the opening of your vagina.  Causes of Rectocele  The most common cause is childbirth.  The muscles and ligaments in the pelvis that hold up and support the female organs and vagina become stretched and weakened during labor and delivery.  The more babies you have, the more the support tissues are stretched and weakened.  Not everyone who has a baby will develop a rectocele.  Some women have stronger supporting tissue in the pelvis and may not have as much of a problem as others.  Women who have a Cesarean section usually do not get rectocele's unless they pushed a long time prior to the cesarean delivery.  Other conditions that can cause a rectocele include chronic constipation, a chronic cough, a lot of heavy lifting, and obesity.  Older women may have this problem because the loss of female hormones causes the vaginal tissue to become weaker.  Symptoms  There may not be any symptoms.  If you do have symptoms, they may include: Pelvic pressure in the rectal area Protrusion of the lower part of the vagina through the opening of the vagina Constipation and trapping of the stool, making it difficult to have a bowel movement.  In severe cases, you may have to press on the lower part of your vagina to help push the stool out of you rectum.  This is called splinting to empty.  Diagnosing Rectocele  Your health care provider will ask about your symptoms and perform a pelvic exam.  S/he will ask you to bear down, pushing like you are having a bowel movement so as to see how far the lower part of the vagina protrudes into the vagina  and possible outside of the vagina.  Your provider will also ask you to contract the muscles of your pelvis (like you are stopping the stream in the middle of urinating) to determine the strength of your pelvic muscles.  Your provider may also do a rectal exam.  Treatment Options  If you do not have any symptoms, no treatment may be necessary.  Other treatment options include: Pelvic floor exercises: Contracting the muscles in your genital area may help strengthen your muscles and support the organs.  Be sure to get proper exercise instruction from you physical therapist. A pessary (removealbe pelvic support device) sometimes helps rectocele symptoms. Surgery: Surgical repair may be necessary. In some cases the uterus may need to be taken out ( a hysterectomy) as well.  There are many types of surgery for pelvic support problems.  Look for physicians who specialize in repair procedures.  You can take care of yourself by: Treating and preventing constipation Avoiding heavy lifting, and lifting correctly (with your legs, not with you waist or back) Treating a chronic cough or bronchitis Not smoking avoiding too much weight gain Doing pelvic floor exercises   2007, [employer] Doc.33  About Constipation  Constipation Overview Constipation is the most common gastrointestinal complaint -- about 4 million Americans experience constipation and make 2.5 million physician visits a year to get help for the problem.  Constipation can occur when the colon absorbs  too much water, the colon's muscle contraction is slow or sluggish, and/or there is delayed transit time through the colon.  The result is stool that is hard and dry.  Indicators of constipation include straining during bowel movements greater than 25% of the time, having fewer than three bowel movements per week, and/or the feeling of incomplete evacuation.  There are established guidelines ([place] II ) for defining constipation. A  person needs to have two or more of the following symptoms for at least 12 weeks (not necessarily consecutive) in the preceding 12 months: Straining in  greater than 25% of bowel movements Lumpy or hard stools in greater than 25% of bowel movements Sensation of incomplete emptying in greater than 25% of bowel movements Sensation of anorectal obstruction/blockade in greater than 25% of bowel movements Manual maneuvers to help empty greater than 25% of bowel movements (e.g., digital evacuation, support of the pelvic floor)  Less than  3 bowel movements/week Loose stools are not present, and criteria for irritable bowel syndrome are insufficient  Common Causes of Constipation Lack of fiber in your diet Lack of physical activity Medications, including iron and calcium supplements  Dairy intake Dehydration Abuse of laxatives Travel Irritable Bowel Syndrome Pregnancy Luteal phase of menstruation (after ovulation and before menses) Colorectal problems Intestinal Dysfunction  Treating Constipation  There are several ways of treating constipation, including changes to diet and exercise, use of laxatives, adjustments to the pelvic floor, and scheduled toileting.  These treatments include: increasing fiber and fluids in the diet  increasing physical activity learning muscle coordination  learning proper toileting techniques and toileting modifications  designing and sticking  to a toileting schedule     2007, [employer] Doc.22  EXERCISE   We recommended that you start or continue a regular exercise program for good health. Physical activity is anything that gets your body moving, some is better than none. The CDC recommends 150 minutes per week of Moderate-Intensity Aerobic Activity and 2 or more days of Muscle Strengthening Activity.  Benefits of exercise are limitless: helps weight loss/weight maintenance, improves mood and energy, helps with depression and anxiety,  improves sleep, tones and strengthens muscles, improves balance, improves bone density, protects from chronic conditions such as heart disease, high blood pressure and diabetes and so much more. To learn more visit: WhyNotPoker.uy  DIET: Good nutrition starts with a healthy diet of fruits, vegetables, whole grains, and lean protein sources. Drink plenty of water for hydration. Minimize empty calories, sodium, sweets. For more information about dietary recommendations visit: GeekRegister.com.ee and http://schaefer-mitchell.com/  ALCOHOL:  Women should limit their alcohol intake to no more than 7 drinks/beers/glasses of wine (combined, not each!) per week. Moderation of alcohol intake to this level decreases your risk of breast cancer and liver damage.  If you are concerned that you may have a problem, or your friends have told you they are concerned about your drinking, there are many resources to help. A well-known program that is free, effective, and available to all people all over the nation is Alcoholics Anonymous.  Check out this site to learn more: BlockTaxes.se   CALCIUM AND VITAMIN D:  Adequate intake of calcium and Vitamin D are recommended for bone health.  You should be getting between 1000-1200 mg of calcium and 800 units of Vitamin D daily between diet and supplements  MAMMOGRAMS:  All women over 67 years old should have a routine mammogram.   COLON CANCER SCREENING: Now recommend starting at age 54. At this  time colonoscopy is not covered for routine screening until 50. There are take home tests that can be done between 45-49.   COLONOSCOPY:  Colonoscopy to screen for colon cancer is recommended for all women at age 23.  We know, you hate the idea of the prep.  We agree, BUT, having colon cancer and not knowing it is worse!!  Colon cancer so often starts as a polyp that can be seen and removed at  colonscopy, which can quite literally save your life!  And if your first colonoscopy is normal and you have no family history of colon cancer, most women don't have to have it again for 10 years.  Once every ten years, you can do something that may end up saving your life, right?  We will be happy to help you get it scheduled when you are ready.  Be sure to check your insurance coverage so you understand how much it will cost.  It may be covered as a preventative service at no cost, but you should check your particular policy.      Breast Self-Awareness Breast self-awareness means being familiar with how your breasts look and feel. It involves checking your breasts regularly and reporting any changes to your health care provider. Practicing breast self-awareness is important. A change in your breasts can be a sign of a serious medical problem. Being familiar with how your breasts look and feel allows you to find any problems early, when treatment is more likely to be successful. All women should practice breast self-awareness, including women who have had breast implants. How to do a breast self-exam One way to learn what is normal for your breasts and whether your breasts are changing is to do a breast self-exam. To do a breast self-exam: Look for Changes  Remove all the clothing above your waist. Stand in front of a mirror in a room with good lighting. Put your hands on your hips. Push your hands firmly downward. Compare your breasts in the mirror. Look for differences between them (asymmetry), such as: Differences in shape. Differences in size. Puckers, dips, and bumps in one breast and not the other. Look at each breast for changes in your skin, such as: Redness. Scaly areas. Look for changes in your nipples, such as: Discharge. Bleeding. Dimpling. Redness. A change in position. Feel for Changes Carefully feel your breasts for lumps and changes. It is best to do this while lying on  your back on the floor and again while sitting or standing in the shower or tub with soapy water on your skin. Feel each breast in the following way: Place the arm on the side of the breast you are examining above your head. Feel your breast with the other hand. Start in the nipple area and make  inch (2 cm) overlapping circles to feel your breast. Use the pads of your three middle fingers to do this. Apply light pressure, then medium pressure, then firm pressure. The light pressure will allow you to feel the tissue closest to the skin. The medium pressure will allow you to feel the tissue that is a little deeper. The firm pressure will allow you to feel the tissue close to the ribs. Continue the overlapping circles, moving downward over the breast until you feel your ribs below your breast. Move one finger-width toward the center of the body. Continue to use the  inch (2 cm) overlapping circles to feel your breast as you move slowly up toward your collarbone.  Continue the up and down exam using all three pressures until you reach your armpit.  Write Down What You Find  Write down what is normal for each breast and any changes that you find. Keep a written record with breast changes or normal findings for each breast. By writing this information down, you do not need to depend only on memory for size, tenderness, or location. Write down where you are in your menstrual cycle, if you are still menstruating. If you are having trouble noticing differences in your breasts, do not get discouraged. With time you will become more familiar with the variations in your breasts and more comfortable with the exam. How often should I examine my breasts? Examine your breasts every month. If you are breastfeeding, the best time to examine your breasts is after a feeding or after using a breast pump. If you menstruate, the best time to examine your breasts is 5-7 days after your period is over. During your period,  your breasts are lumpier, and it may be more difficult to notice changes. When should I see my health care provider? See your health care provider if you notice: A change in shape or size of your breasts or nipples. A change in the skin of your breast or nipples, such as a reddened or scaly area. Unusual discharge from your nipples. A lump or thick area that was not there before. Pain in your breasts. Anything that concerns you.

## 2022-03-14 ENCOUNTER — Other Ambulatory Visit: Payer: Self-pay | Admitting: Obstetrics and Gynecology

## 2022-03-14 DIAGNOSIS — E2839 Other primary ovarian failure: Secondary | ICD-10-CM

## 2022-03-14 DIAGNOSIS — Z1231 Encounter for screening mammogram for malignant neoplasm of breast: Secondary | ICD-10-CM

## 2022-04-06 ENCOUNTER — Ambulatory Visit: Payer: Medicare Other | Admitting: Family

## 2022-04-29 ENCOUNTER — Ambulatory Visit
Admission: RE | Admit: 2022-04-29 | Discharge: 2022-04-29 | Disposition: A | Discharge status: Home / Self Care | Payer: Medicare Other | Source: Ambulatory Visit | Attending: Obstetrics and Gynecology | Admitting: Obstetrics and Gynecology

## 2022-04-29 DIAGNOSIS — Z1231 Encounter for screening mammogram for malignant neoplasm of breast: Secondary | ICD-10-CM

## 2022-08-16 ENCOUNTER — Ambulatory Visit: Payer: Medicare Other | Admitting: Internal Medicine

## 2022-08-16 ENCOUNTER — Encounter: Payer: Self-pay | Admitting: Internal Medicine

## 2022-08-16 VITALS — BP 110/70 | HR 64 | Temp 97.9°F | Ht 59.0 in | Wt 134.6 lb

## 2022-08-16 DIAGNOSIS — E348 Other specified endocrine disorders: Secondary | ICD-10-CM

## 2022-08-16 DIAGNOSIS — D369 Benign neoplasm, unspecified site: Secondary | ICD-10-CM

## 2022-08-16 DIAGNOSIS — E041 Nontoxic single thyroid nodule: Secondary | ICD-10-CM

## 2022-08-16 HISTORY — DX: Benign neoplasm, unspecified site: D36.9

## 2022-08-16 NOTE — Progress Notes (Signed)
[facility] [address] [place] [address] Dept: [phone] Dept Fax: [phone]  New Patient Office Visit  Subjective:   Christina Gross 1955-11-28 08/16/2022  Chief Complaint  Patient presents with   Establish Care    HPI: Christina Gross presents today to establish care at [facility] at [facility]. Introduced to Publishing rights manager role and practice setting.  All questions answered.  Concerns: See below   Discussed the use of AI scribe software for clinical note transcription with the patient, who gave verbal consent to proceed.  History of Present Illness   The patient, with a history of a benign thyroid nodule and intraductal papilloma, is establishing care after a period of not having a primary care provider due to insurance changes and the COVID-19 pandemic. She has been in the area for 10 years and previously saw a nurse practitioner named Boyd Kerbs.  She now has Medicare and prefers [facility] due to its interconnected system.  The patient has a history of a benign thyroid nodule that was biopsied and found to be calcified. She has been monitoring it for changes in size or symptoms. She also had a lumpectomy on the right breast in 2017 due to an intraductal papilloma, which was discovered after she noticed a small amount of liquid discharge from the breast. The procedure was minor and left no visible mark.  The patient is up to date on her mammogram and colonoscopy, and she is scheduled for a bone density test tomorrow. She is also up to date on her tetanus shot. She has not had an annual physical in approximately two years. She is not currently taking any medications and has no history of high blood pressure, diabetes, or high cholesterol.     No new concerns voiced by patient.  The following portions of the patient's history were reviewed and updated as appropriate: past medical history, past  surgical history, family history, social history, allergies, medications, and problem list.   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/2021   Hx of adenomatous polyp of colon 03/20/2020   Past Medical History:  Diagnosis Date   Genital warts    Hx of adenomatous polyp of colon 03/20/2020   Past Surgical History:  Procedure Laterality Date   BREAST BIOPSY Right 06/04/2015   Intraductal papilloma   BREAST EXCISIONAL BIOPSY Right 07/16/2015   Intraductal papilloma   BREAST LUMPECTOMY WITH RADIOACTIVE SEED LOCALIZATION Right 07/16/2015   Procedure: RIGHT BREAST LUMPECTOMY WITH RADIOACTIVE SEED LOCALIZATION;  Surgeon: Chevis Pretty III, MD;  Location: [facility];  Service: General;  Laterality: Right;   COLONOSCOPY  03/20/2020   Most recent had one 10+ years prior also   THYROID CYST EXCISION     benign   TONSILLECTOMY     WISDOM TOOTH EXTRACTION     Family History  Problem Relation Age of Onset   Breast cancer Mother 36   Cancer Father    Cancer Sister        lung   Cancer Brother        esophageal   Esophageal cancer Brother    Colon cancer Neg Hx    Colon polyps Neg Hx    Rectal cancer Neg Hx    Stomach cancer Neg Hx    No outpatient medications prior to visit.   No facility-administered medications prior to visit.   No Known Allergies  ROS: A complete ROS was performed with pertinent positives/negatives noted in the HPI. The remainder of the ROS are negative.   Objective:   Today's Vitals   08/16/22 0941  BP: 110/70  Pulse: 64  Temp: 97.9 F (36.6 C)  TempSrc: Temporal  SpO2: 98%  Weight: 134 lb 9.6 oz (61.1 kg)  Height: 4\' 11"  (1.499 m)    GENERAL: Well-appearing, in NAD. Well nourished.  SKIN: Pink, warm and dry. No rash, lesion, ulceration, or ecchymoses.  NECK: Trachea midline. Full ROM w/o pain or tenderness. No lymphadenopathy.  RESPIRATORY: Chest wall symmetrical.  Respirations even and non-labored. Breath sounds clear to auscultation bilaterally.  CARDIAC: S1, S2 present, regular rate and rhythm. Peripheral pulses 2+ bilaterally.  EXTREMITIES: Without clubbing, cyanosis, or edema.  NEUROLOGIC: Steady, even gait.  PSYCH/MENTAL STATUS: Alert, oriented x 3. Cooperative, appropriate mood and affect.   Health Maintenance Due  Topic Date Due   Medicare Annual Wellness (AWV)  Never done   DEXA SCAN  Never done    No results found for any visits on 08/16/22.  Assessment & Plan:  Assessment and Plan     Thyroid Nodule: Calcified thyroid nodule previously biopsied with benign results. No current symptoms or changes in size reported. -Monitor for changes in size or symptoms.  Intraductal Papilloma: History of intraductal papilloma in the right breast, treated with lumpectomy in 2017. No current symptoms or changes reported. -Monitor for changes or symptoms. 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   General Health Maintenance: Up to date on mammogram and colonoscopy. Bone density scan scheduled. Last Pap smear unknown, but patient reports she have been stopped due to consistent normal results. Tetanus shot up to date. -Schedule "Welcome to Medicare" visit in 4 weeks (early August 2024) for comprehensive health maintenance review and fasting blood work.  Gynecological Care: Current gynecologist retiring, patient seeking new provider. -Encourage patient to follow up with preferred provider in current office.      Return in about 4 weeks (around 09/13/2022) for Welcome to Medicare Exam.   Of note, portions of this note may have been created with voice recognition software Physicist, medical). While this note has been edited for accuracy, occasional wrong-word or 'sound-a-like' substitutions may have occurred due to the inherent limitations of voice recognition software.  Salvatore Decent, FNP

## 2022-08-17 ENCOUNTER — Ambulatory Visit
Admission: RE | Admit: 2022-08-17 | Discharge: 2022-08-17 | Disposition: A | Discharge status: Home / Self Care | Payer: Medicare Other | Source: Ambulatory Visit | Attending: Obstetrics and Gynecology | Admitting: Obstetrics and Gynecology

## 2022-08-17 DIAGNOSIS — E2839 Other primary ovarian failure: Secondary | ICD-10-CM

## 2022-08-17 NOTE — Progress Notes (Signed)
I would recommend using a bisphosphonate (fosamax or boniva) based on her results to prevent any further bone loss. If she prefers to wait until February to discuss I would recommend she take the supplements and exercise regularly.

## 2022-09-08 ENCOUNTER — Telehealth: Payer: Self-pay | Admitting: Internal Medicine

## 2022-09-09 ENCOUNTER — Other Ambulatory Visit: Payer: Medicare Other

## 2022-09-13 ENCOUNTER — Encounter: Payer: Self-pay | Admitting: Internal Medicine

## 2022-09-13 ENCOUNTER — Ambulatory Visit (INDEPENDENT_AMBULATORY_CARE_PROVIDER_SITE_OTHER): Payer: Medicare Other | Admitting: Internal Medicine

## 2022-09-13 VITALS — BP 100/60 | HR 61 | Temp 97.7°F | Ht 59.0 in | Wt 132.0 lb

## 2022-09-13 DIAGNOSIS — Z136 Encounter for screening for cardiovascular disorders: Secondary | ICD-10-CM

## 2022-09-13 DIAGNOSIS — Z0000 Encounter for general adult medical examination without abnormal findings: Secondary | ICD-10-CM | POA: Diagnosis not present

## 2022-09-13 NOTE — Progress Notes (Signed)
Subjective:  Patient presents today for their Welcome to Medicare Exam    Preventive Screening-Counseling & Management  Vision screen:  Hearing Screening   500Hz  1000Hz  2000Hz  4000Hz   Right ear 20 20 20 20   Left ear 20 20 20 20    Vision Screening   Right eye Left eye Both eyes  Without correction     With correction 20/25 20/25 20/25     Advanced directives: yes  Modifiable Risk Factors/behavioral risk assessment/psychosocial risk assessment Regular exercise: yes - yoga , walk 1 hr per day, bike riding  Diet: yes, adheres to healthy diet   Wt Readings from Last 3 Encounters:  09/13/22 132 lb (59.9 kg)  08/16/22 134 lb 9.6 oz (61.1 kg)  03/11/22 133 lb (60.3 kg)   Smoking Status: Never Smoker Second Hand Smoking status: No smokers in home Alcohol intake: 1 glass wine per day Other substance abuse/illicit drugs: no  Cardiac risk factors:  advanced age (older than 68 for men, 76 for women)  no Hyperlipidemia  no Hypertension  No diabetes.  No results found for: "HGBA1C" Family History:   Family History  Problem Relation Age of Onset   Breast cancer Mother 71   Cancer Father    Cancer Sister        lung   Cancer Brother        esophageal   Esophageal cancer Brother    Colon cancer Neg Hx    Colon polyps Neg Hx    Rectal cancer Neg Hx    Stomach cancer Neg Hx     Depression Screen/risk evaluation Risk factors: non.Marland Kitchen PHQ2 0     09/13/2022    1:57 PM 08/16/2022    9:43 AM  Depression screen PHQ 2/9  Decreased Interest 0 0  Down, Depressed, Hopeless 0 0  PHQ - 2 Score 0 0  Altered sleeping 0 0  Tired, decreased energy 0 0  Change in appetite 0 0  Feeling bad or failure about yourself  0 0  Trouble concentrating 0 0  Moving slowly or fidgety/restless 0 0  Suicidal thoughts 0 0  PHQ-9 Score 0 0  Difficult doing work/chores Not difficult at all Not difficult at all    Functional ability and level of safety Mobility assessment:  timed get up and go  <12 seconds Activities of Daily Living- Independent in ADLs (toileting, bathing, dressing, transferring, eating) and in IADLs (shopping, housekeeping, managing own medications, and handling finances) Home Safety: Loose rugs (no), smoke detectors (yes), small pets (no), grab bars (no), stairs (yes), life-alert system (no) Hearing Difficulties: no  Hearing Screening   500Hz  1000Hz  2000Hz  4000Hz   Right ear 20 20 20 20   Left ear 20 20 20 20    Vision Screening   Right eye Left eye Both eyes  Without correction     With correction 20/25 20/25 20/25      Fall Risk: None     09/13/2022    1:57 PM 09/11/2022   12:53 PM 09/06/2022    6:46 PM 08/16/2022    9:43 AM  Fall Risk   Falls in the past year? 0 0 0 0  Number falls in past yr: 0   0  Injury with Fall? 0   0  Risk for fall due to : No Fall Risks   No Fall Risks  Follow up Falls prevention discussed   Falls prevention discussed   Opioid use history:  no long term opioids use Self assessment of health  status: good   Required Immunizations needed today:  Prevnar 20 , Shingrix - patient declines  Immunization History  Administered Date(s) Administered   COVID-19, mRNA, vaccine(Comirnaty)12 years and older 04/10/2019, 05/01/2019, 11/17/2019   Influenza-Unspecified 02/19/2022   Tdap 09/07/2020   Unspecified SARS-COV-2 Vaccination 10/24/2000, 06/13/2020, 02/19/2022   Health Maintenance  Topic Date Due   Pneumonia Vaccine (1 of 1 - PCV) 02/16/2023*   COVID-19 Vaccine (7 - 2023-24 season) 02/16/2023*   Hepatitis C Screening  02/16/2023*   HIV Screening  02/16/2023*   Zoster (Shingles) Vaccine (1 of 2) 02/16/2023*   Flu Shot  05/01/2023*   Pap Smear  01/16/2023   Medicare Annual Wellness Visit  09/13/2023   Mammogram  04/28/2024   Colon Cancer Screening  03/21/2027   DTaP/Tdap/Td vaccine (2 - Td or Tdap) 09/08/2030   DEXA scan (bone density measurement)  Completed   HPV Vaccine  Aged Out  *Topic was postponed. The date shown is  not the original due date.    Screening tests-  There are no preventive care reminders to display for this patient.  Colon cancer screening- due  03/2027 Lung Cancer screening- N/A 3    Prostate cancer screening - N/A No results found for: "PSA"  4. Cervical cancer screening- due 01/2023 5. 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   Hx of adenomatous polyp of colon 03/20/2020   Past Surgical History:  Procedure Laterality Date   BREAST BIOPSY Right 06/04/2015   Intraductal papilloma   BREAST EXCISIONAL BIOPSY Right 07/16/2015   Intraductal papilloma   BREAST LUMPECTOMY WITH RADIOACTIVE SEED LOCALIZATION Right 07/16/2015   Procedure: RIGHT BREAST LUMPECTOMY WITH RADIOACTIVE SEED LOCALIZATION;  Surgeon: Chevis Pretty III, MD;  Location: [facility];  Service: General;  Laterality: Right;   COLONOSCOPY  03/20/2020   Most recent had one 10+ years prior also   THYROID CYST EXCISION     benign   TONSILLECTOMY     WISDOM TOOTH EXTRACTION      Family History  Problem Relation Age of Onset   Breast cancer Mother 53   Cancer Father    Cancer Sister        lung   Cancer Brother        esophageal   Esophageal cancer Brother    Colon cancer Neg Hx    Colon polyps Neg Hx    Rectal cancer Neg Hx    Stomach cancer Neg Hx     Medications- reviewed and updated No current outpatient medications on file.   No current facility-administered medications for this visit.    Allergies-reviewed and updated No Known Allergies  Social History   Socioeconomic History   Marital status: Married    Spouse name: Not on file   Number of children:  Not on file   Years of education: Not on file   Highest education level: Not on file  Occupational History   Not on file  Tobacco Use   Smoking status: Never   Smokeless tobacco: Never  Vaping Use   Vaping status: Never Used  Substance and Sexual Activity   Alcohol use: Yes  Comment: occas   Drug use: No   Sexual activity: Not Currently    Partners: Male    Birth control/protection: Post-menopausal    Comment: <5 sexual partners, >72 y/o, no STD, no abnormal  Other Topics Concern   Not on file  Social History Narrative   Not on file   Social Determinants of Health   Financial Resource Strain: Not on file  Food Insecurity: No Food Insecurity (09/07/2020)   Received [facility]   Hunger Vital Sign    Worried About Running Out of Food in the Last Year: Never true    Ran Out of Food in the Last Year: Never true  Transportation Needs: Not on file  Physical Activity: Not on file  Stress: Not on file  Social Connections: Unknown (06/15/2021)   Received from [employer]   Social Network    Social Network: Not on file   Objective  Objective:  BP 100/60   Pulse 61   Temp 97.7 F (36.5 C) (Temporal)   Ht 4\' 11"  (1.499 m)   Wt 132 lb (59.9 kg)   SpO2 98%   BMI 26.66 kg/m  Gen: NAD, resting comfortably HEENT: Mucous membranes are moist. Oropharynx normal, PERRLA. Neck: no thyromegaly CV: RRR no murmurs rubs or gallops Lungs: CTAB no crackles, wheeze, rhonchi Abdomen: soft/nontender/nondistended/normal bowel sounds. No rebound or guarding.  Ext: no edema Skin: warm, dry Neuro: grossly normal, moves all extremities.   EKG RESULT: EKG tracing is personally reviewed.   EKG: sinus bradycardia.     Assessment and Plan:   Welcome to Medicare exam completed-  Educated, counseled and referred based on above elements Educated, counseled and referred as appropriate for preventative needs Discussed and documented a written plan for preventiative services and  screenings with personalized health advice- After Visit Summary was given to patient which included this plan  4. EKG offered [id number]- patient accepted.    Status of chronic or acute concerns  Stable, no acute concerns No specialty comments available.  No problem-specific Assessment & Plan notes found for this encounter.   Recommended follow up: 1 year  Future Appointments  Date Time Provider Department Center  03/14/2023 10:30 AM Earley Favor, MD [facility] None     Lab/Order associations:   ICD-10-CM   1. Welcome to Medicare preventive visit  Z00.00 EKG 12-Lead    CBC with Differential/Platelet    Comprehensive metabolic panel    Lipid panel    TSH      Return precautions advised. Salvatore Decent, FNP

## 2022-09-15 ENCOUNTER — Encounter: Payer: Medicare Other | Admitting: Internal Medicine

## 2022-10-24 NOTE — Telephone Encounter (Signed)
error 

## 2023-03-14 ENCOUNTER — Ambulatory Visit: Payer: Medicare Other | Admitting: Obstetrics and Gynecology

## 2023-03-20 ENCOUNTER — Ambulatory Visit (INDEPENDENT_AMBULATORY_CARE_PROVIDER_SITE_OTHER): Payer: Medicare Other | Admitting: Obstetrics and Gynecology

## 2023-03-20 ENCOUNTER — Encounter: Payer: Self-pay | Admitting: Obstetrics and Gynecology

## 2023-03-20 ENCOUNTER — Other Ambulatory Visit (HOSPITAL_COMMUNITY)
Admission: RE | Admit: 2023-03-20 | Discharge: 2023-03-20 | Disposition: A | Discharge status: Home / Self Care | Payer: Medicare Other | Source: Ambulatory Visit | Attending: Obstetrics and Gynecology | Admitting: Obstetrics and Gynecology

## 2023-03-20 VITALS — BP 100/72 | HR 62 | Ht 59.25 in | Wt 130.0 lb

## 2023-03-20 DIAGNOSIS — A630 Anogenital (venereal) warts: Secondary | ICD-10-CM | POA: Diagnosis not present

## 2023-03-20 DIAGNOSIS — Z1151 Encounter for screening for human papillomavirus (HPV): Secondary | ICD-10-CM | POA: Insufficient documentation

## 2023-03-20 DIAGNOSIS — Z9189 Other specified personal risk factors, not elsewhere classified: Secondary | ICD-10-CM | POA: Diagnosis not present

## 2023-03-20 DIAGNOSIS — Z1331 Encounter for screening for depression: Secondary | ICD-10-CM

## 2023-03-20 DIAGNOSIS — Z01419 Encounter for gynecological examination (general) (routine) without abnormal findings: Secondary | ICD-10-CM

## 2023-03-20 DIAGNOSIS — Z124 Encounter for screening for malignant neoplasm of cervix: Secondary | ICD-10-CM | POA: Diagnosis not present

## 2023-03-20 DIAGNOSIS — N952 Postmenopausal atrophic vaginitis: Secondary | ICD-10-CM

## 2023-03-20 MED ORDER — ESTRADIOL 0.1 MG/GM VA CREA: TOPICAL_CREAM | VAGINAL | 12 refills | Status: AC

## 2023-03-20 NOTE — Progress Notes (Signed)
 68 y.o. y.o. female here for annual exam. No LMP recorded. Patient is postmenopausal.    [id number] Married White or Caucasian Not Hispanic or Latino female here for annual exam.  No vaginal bleeding. Not sexually active.  Lived for 15 years in [place], [place].   Exercising: Yes.    Yoga and walking Smoker:  no  Health Maintenance: Pap:   01/16/20 wnl hr hpv neg.   History of abnormal Pap:  no MMG: 05/02/22 density B Bi-rads 1 neg  BMD:   08/17/22 -1.5 AP spine osteopenia. Repeat q 2 years Colonoscopy: 03/20/20, polyp, f/u 7 years  TDaP:  09/07/20  Gardasil: n/a Body mass index is 26.04 kg/m.     03/20/2023   10:34 AM 09/13/2022    1:57 PM 08/16/2022    9:43 AM  Depression screen PHQ 2/9  Decreased Interest 0 0 0  Down, Depressed, Hopeless 0 0 0  PHQ - 2 Score 0 0 0  Altered sleeping  0 0  Tired, decreased energy  0 0  Change in appetite  0 0  Feeling bad or failure about yourself   0 0  Trouble concentrating  0 0  Moving slowly or fidgety/restless  0 0  Suicidal thoughts  0 0  PHQ-9 Score  0 0  Difficult doing work/chores  Not difficult at all Not difficult at all    Blood pressure 100/72, pulse 62, height 4' 11.25" (1.505 m), weight 130 lb (59 kg), SpO2 99%.     Component Value Date/Time   DIAGPAP  01/16/2020 1632    - Negative for intraepithelial lesion or malignancy (NILM)   HPVHIGH Negative 01/16/2020 1632   ADEQPAP  01/16/2020 1632    Satisfactory for evaluation; transformation zone component PRESENT.    GYN HISTORY:    Component Value Date/Time   DIAGPAP  01/16/2020 1632    - Negative for intraepithelial lesion or malignancy (NILM)   HPVHIGH Negative 01/16/2020 1632   ADEQPAP  01/16/2020 1632    Satisfactory for evaluation; transformation zone component PRESENT.    OB History  Gravida Para Term Preterm AB Living  2 2 2   2   SAB IAB Ectopic Multiple Live Births      2    # Outcome Date GA Lbr Len/2nd Weight Sex Type Anes PTL Lv  2 Term     F    LIV  1  Term     M    LIV    Past Medical History:  Diagnosis Date   Genital warts    Hx of adenomatous polyp of colon 03/20/2020   Intraductal papilloma 08/16/2022    Past Surgical History:  Procedure Laterality Date   BREAST BIOPSY Right 06/04/2015   Intraductal papilloma   BREAST EXCISIONAL BIOPSY Right 07/16/2015   Intraductal papilloma   BREAST LUMPECTOMY WITH RADIOACTIVE SEED LOCALIZATION Right 07/16/2015   Procedure: RIGHT BREAST LUMPECTOMY WITH RADIOACTIVE SEED LOCALIZATION;  Surgeon: Chevis Pretty III, MD;  Location: [facility];  Service: General;  Laterality: Right;   COLONOSCOPY  03/20/2020   Most recent had one 10+ years prior also   THYROID CYST EXCISION     benign   TONSILLECTOMY     WISDOM TOOTH EXTRACTION      Current Outpatient Medications on File Prior to Visit  Medication Sig Dispense Refill   CALCIUM PO Take by mouth. 1200mg -Vit D-25mg      MAGNESIUM PO Take by mouth. 500mg      Multiple Vitamin (MULTIVITAMIN  PO) Take by mouth. +Areds2     No current facility-administered medications on file prior to visit.    Social History   Socioeconomic History   Marital status: Married    Spouse name: Not on file   Number of children: Not on file   Years of education: Not on file   Highest education level: Not on file  Occupational History   Not on file  Tobacco Use   Smoking status: Never   Smokeless tobacco: Never  Vaping Use   Vaping status: Never Used  Substance and Sexual Activity   Alcohol use: Yes    Comment: occas   Drug use: No   Sexual activity: Not Currently    Partners: Male    Birth control/protection: Post-menopausal    Comment: <5 sexual partners, >35 y/o, no STD, no abnormal  Other Topics Concern   Not on file  Social History Narrative   Not on file   Social Drivers of Health   Financial Resource Strain: Not on file  Food Insecurity: No Food Insecurity (09/07/2020)   Received from [facility]   Hunger Vital  Sign    Worried About Running Out of Food in the Last Year: Never true    Ran Out of Food in the Last Year: Never true  Transportation Needs: Not on file  Physical Activity: Not on file  Stress: Not on file  Social Connections: Unknown (06/15/2021)   Received from [facility], [facility]   Social Network    Social Network: Not on file  Intimate Partner Violence: Unknown (05/07/2021)   [facility], [facility]   HITS    Physically Hurt: Not on file    Insult or Talk Down To: Not on file    Threaten Physical Harm: Not on file    Scream or Curse: Not on file    Family History  Problem Relation Age of Onset   Breast cancer Mother 24   Cancer Father    Cancer Sister        lung   Cancer Brother        esophageal   Esophageal cancer Brother    Colon cancer Neg Hx    Colon polyps Neg Hx    Rectal cancer Neg Hx    Stomach cancer Neg Hx      No Known Allergies    Patient's last menstrual period was No LMP recorded. Patient is postmenopausal..          Daily walking and yoga  Review of Systems Alls systems reviewed and are negative.     Physical Exam Constitutional:      Appearance: Normal appearance.  Genitourinary:     Vulva and urethral meatus normal.     No lesions in the vagina.     Right Labia: No rash, lesions or skin changes.    Left Labia: No lesions, skin changes or rash.    No vaginal discharge or tenderness.     No vaginal prolapse present.    Severe vaginal atrophy present.     Right Adnexa: not tender, not palpable and no mass present.    Left Adnexa: not tender, not palpable and no mass present.    No cervical motion tenderness or discharge.     Uterus is not enlarged, tender or irregular.  Breasts:    Right: Normal.     Left: Normal.  HENT:     Head: Normocephalic.  Neck:  Thyroid: No thyroid mass, thyromegaly or thyroid tenderness.  Cardiovascular:     Rate and Rhythm: Normal rate and regular rhythm.     Heart  sounds: Normal heart sounds, S1 normal and S2 normal.  Pulmonary:     Effort: Pulmonary effort is normal.     Breath sounds: Normal breath sounds and air entry.  Abdominal:     General: There is no distension.     Palpations: Abdomen is soft. There is no mass.     Tenderness: There is no abdominal tenderness. There is no guarding or rebound.  Musculoskeletal:        General: Normal range of motion.     Cervical back: Full passive range of motion without pain, normal range of motion and neck supple. No tenderness.     Right lower leg: No edema.     Left lower leg: No edema.  Neurological:     Mental Status: She is alert.  Skin:    General: Skin is warm.  Psychiatric:        Mood and Affect: Mood normal.        Behavior: Behavior normal.        Thought Content: Thought content normal.  Vitals and nursing note reviewed. Exam conducted with a chaperone present.       A:         Well Woman GYN exam                             P:        Pap smear collected today Encouraged annual mammogram screening Colon cancer screening up-to-date DXA up-to-date Labs and immunizations to do with PMD Discussed breast self exams Encouraged healthy lifestyle practices Encouraged Vit D and Calcium  Atrophic vaginitis: to begin estrace cream.  Counseled on safety and importance three times a week.  No follow-ups on file.  Earley Favor

## 2023-03-22 ENCOUNTER — Encounter: Payer: Self-pay | Admitting: Obstetrics and Gynecology

## 2023-03-22 ENCOUNTER — Other Ambulatory Visit: Payer: Self-pay

## 2023-03-22 DIAGNOSIS — R87620 Atypical squamous cells of undetermined significance on cytologic smear of vagina (ASC-US): Secondary | ICD-10-CM

## 2023-03-22 LAB — CYTOLOGY - PAP
Adequacy: ABSENT
Comment: NEGATIVE
Comment: NEGATIVE
Comment: NEGATIVE
Diagnosis: UNDETERMINED — AB
HPV 16: POSITIVE — AB
HPV 18 / 45: NEGATIVE
High risk HPV: POSITIVE — AB

## 2023-04-17 ENCOUNTER — Ambulatory Visit: Payer: Medicare Other | Admitting: Obstetrics and Gynecology

## 2023-04-17 ENCOUNTER — Encounter: Payer: Self-pay | Admitting: Obstetrics and Gynecology

## 2023-04-17 VITALS — BP 126/82 | HR 61

## 2023-04-17 DIAGNOSIS — R87610 Atypical squamous cells of undetermined significance on cytologic smear of cervix (ASC-US): Secondary | ICD-10-CM | POA: Diagnosis not present

## 2023-04-17 DIAGNOSIS — R87620 Atypical squamous cells of undetermined significance on cytologic smear of vagina (ASC-US): Secondary | ICD-10-CM

## 2023-04-17 DIAGNOSIS — R87811 Vaginal high risk human papillomavirus (HPV) DNA test positive: Secondary | ICD-10-CM

## 2023-04-18 ENCOUNTER — Other Ambulatory Visit (HOSPITAL_COMMUNITY)
Admission: RE | Admit: 2023-04-18 | Discharge: 2023-04-18 | Disposition: A | Discharge status: Home / Self Care | Source: Ambulatory Visit | Attending: Obstetrics and Gynecology | Admitting: Obstetrics and Gynecology

## 2023-04-18 DIAGNOSIS — R87810 Cervical high risk human papillomavirus (HPV) DNA test positive: Secondary | ICD-10-CM | POA: Diagnosis present

## 2023-04-18 DIAGNOSIS — R87610 Atypical squamous cells of undetermined significance on cytologic smear of cervix (ASC-US): Secondary | ICD-10-CM | POA: Insufficient documentation

## 2023-04-18 NOTE — Progress Notes (Signed)
 Colposcopy Procedure Note Duane Trias 04/18/2023  Indications: ASCUS HPV DNA positive Genotype positive for 16  Procedure Details  The risks and benefits of the procedure and Written informed consent obtained. Married for 42 years. H/o genital warts. No abnormal pap smears in the past     Component Value Date/Time   DIAGPAP (A) 03/20/2023 1046    - Atypical squamous cells of undetermined significance (ASC-US)   DIAGPAP  01/16/2020 1632    - Negative for intraepithelial lesion or malignancy (NILM)   HPVHIGH Positive (A) 03/20/2023 1046   HPVHIGH Negative 01/16/2020 1632   ADEQPAP  03/20/2023 1046    Satisfactory for evaluation; transformation zone component ABSENT.   ADEQPAP  01/16/2020 1632    Satisfactory for evaluation; transformation zone component PRESENT.    High Risk HPV: Positive  Adequacy:  Satisfactory for evaluation, transformation zone component PRESENT  Diagnosis:  Atypical squamous cells of undetermined significance (ASC-US)  Speculum placed in vagina and excellent visualization of cervix achieved, cervix swabbed x 3 with acetic acid solution.  Impression:cin1  Satisfactory( ECC zone seen): yes  Past Medical History:  Diagnosis Date   Genital warts    Hx of adenomatous polyp of colon 03/20/2020   Intraductal papilloma 08/16/2022   Past Surgical History:  Procedure Laterality Date   BREAST BIOPSY Right 06/04/2015   Intraductal papilloma   BREAST EXCISIONAL BIOPSY Right 07/16/2015   Intraductal papilloma   BREAST LUMPECTOMY WITH RADIOACTIVE SEED LOCALIZATION Right 07/16/2015   Procedure: RIGHT BREAST LUMPECTOMY WITH RADIOACTIVE SEED LOCALIZATION;  Surgeon: Chevis Pretty III, MD;  Location: [facility];  Service: General;  Laterality: Right;   COLONOSCOPY  03/20/2020   Most recent had one 10+ years prior also   THYROID CYST EXCISION     benign   TONSILLECTOMY     WISDOM TOOTH EXTRACTION      Findings:  Cervix colposcopy biopsy  taken: 3 O'clock for acetowhite changes 7 O'clock for  acetowhite changes 12 O'clock for  acetowhite changes  ECC performed with cytobrush  Hemostasis obtained with application of Monsel's Solution    Complications:    Patient tolerated the procedure well  Plan:  To notify patient in epic and phone call of results and plan of care Discussed she must have two consecutive normal pap smears Q41months before returning to annual pap smears Counseled on off label use of gardesil vaccination over the age of 14.  Offered to patient, but would be out of pocket cost.   Earley Favor

## 2023-04-19 ENCOUNTER — Encounter: Payer: Self-pay | Admitting: Obstetrics and Gynecology

## 2023-04-19 LAB — SURGICAL PATHOLOGY

## 2023-04-27 ENCOUNTER — Encounter: Payer: Self-pay | Admitting: Obstetrics and Gynecology

## 2023-05-04 NOTE — Telephone Encounter (Addendum)
 Spoke with patient, advised most insurance do not cover Gardasil vaccines over age of 33. Patient states she has not reviewed with her insurance, wants to proceed with vaccine as discussed with Dr. Karma Greaser.   Advised patient would be responsible for cost of vaccine and injection fee up front and sign waiver. Patient agreeable. Patient is aware the vaccine is a series of 3. Estimate of $425 per injection and $59 for injection fee provided.   Patient is going to check with her insurance, requesting return call to schedule.   Lupita Leash -will you contact patient to schedule and review OOP cost.

## 2023-07-18 ENCOUNTER — Other Ambulatory Visit: Payer: Self-pay | Admitting: Internal Medicine

## 2023-07-18 DIAGNOSIS — Z1231 Encounter for screening mammogram for malignant neoplasm of breast: Secondary | ICD-10-CM

## 2023-07-18 NOTE — Telephone Encounter (Signed)
 See previous MyChart encounter dated 04/27/23.   Gardasil vaccine recommended by Dr. Tia Flowers, patient notified of OOP cost.   Routing to [facility]

## 2023-08-01 ENCOUNTER — Ambulatory Visit
Admission: RE | Admit: 2023-08-01 | Discharge: 2023-08-01 | Disposition: A | Discharge status: Home / Self Care | Source: Ambulatory Visit | Attending: Internal Medicine | Admitting: Internal Medicine

## 2023-08-01 DIAGNOSIS — Z1231 Encounter for screening mammogram for malignant neoplasm of breast: Secondary | ICD-10-CM

## 2023-08-03 ENCOUNTER — Ambulatory Visit: Payer: Self-pay | Admitting: Obstetrics and Gynecology

## 2023-08-15 ENCOUNTER — Ambulatory Visit (INDEPENDENT_AMBULATORY_CARE_PROVIDER_SITE_OTHER)

## 2023-08-15 DIAGNOSIS — Z23 Encounter for immunization: Secondary | ICD-10-CM | POA: Diagnosis not present

## 2023-09-13 ENCOUNTER — Ambulatory Visit (INDEPENDENT_AMBULATORY_CARE_PROVIDER_SITE_OTHER): Payer: Medicare Other | Admitting: Internal Medicine

## 2023-09-13 ENCOUNTER — Encounter: Payer: Self-pay | Admitting: Internal Medicine

## 2023-09-13 VITALS — BP 104/70 | HR 69 | Temp 98.5°F | Ht 59.25 in | Wt 133.2 lb

## 2023-09-13 DIAGNOSIS — Z0000 Encounter for general adult medical examination without abnormal findings: Secondary | ICD-10-CM

## 2023-09-13 NOTE — Progress Notes (Signed)
 Subjective:   Christina Gross [birth year]  09/13/2023   CC: Chief Complaint  Patient presents with   Annual Exam    Not fasting     HPI: Christina Gross is a 68 y.o. female who presents for a routine health maintenance exam.  Labs not collected at time of visit. Patient will return for fasting lab work   HEALTH SCREENINGS: - Pap smear: pap done - ASCUS with high HPV in February 2025. Underwent colposcopy which showed LGSIL- has repeat pap in September 2025 with OBGYN - Mammogram (40+): Up to date  - Colonoscopy (45+): Up to date  - Bone Density (65+): Up to date  - Lung CA screening with low-dose CT:  Not applicable Adults age 44-80 who are current cigarette smokers or quit within the last 15 years. Must have 20 pack year history.   Depression and Anxiety Screen done today and results listed below:     09/13/2023    8:56 AM 03/20/2023   10:34 AM 09/13/2022    1:57 PM 08/16/2022    9:43 AM  Depression screen PHQ 2/9  Decreased Interest 0 0 0 0  Down, Depressed, Hopeless 0 0 0 0  PHQ - 2 Score 0 0 0 0  Altered sleeping   0 0  Tired, decreased energy   0 0  Change in appetite   0 0  Feeling bad or failure about yourself    0 0  Trouble concentrating   0 0  Moving slowly or fidgety/restless   0 0  Suicidal thoughts   0 0  PHQ-9 Score   0 0  Difficult doing work/chores   Not difficult at all Not difficult at all      09/13/2022    1:57 PM 08/16/2022    9:44 AM  GAD 7 : Generalized Anxiety Score  Nervous, Anxious, on Edge 0 0  Control/stop worrying 0 0  Worry too much - different things 0 0  Trouble relaxing 0   Restless 0 0  Easily annoyed or irritable 0 0  Afraid - awful might happen 0 0  Total GAD 7 Score 0   Anxiety Difficulty Not difficult at all     IMMUNIZATIONS: - Tdap: Tetanus vaccination status reviewed: last tetanus booster within 10 years. - HPV: being given by OBGYN - Influenza: Postponed to flu season - Prevnar 20: declined - Zostavax  (50+): declined   Past medical history, surgical history, medications, allergies, family history and social history reviewed with patient today and changes made to appropriate areas of the chart.   Past Medical History:  Diagnosis Date   Genital warts    Hx of adenomatous polyp of colon 03/20/2020   Intraductal papilloma 08/16/2022    Past Surgical History:  Procedure Laterality Date   BREAST BIOPSY Right 06/04/2015   Intraductal papilloma   BREAST EXCISIONAL BIOPSY Right 07/16/2015   Intraductal papilloma   BREAST LUMPECTOMY WITH RADIOACTIVE SEED LOCALIZATION Right 07/16/2015   Procedure: RIGHT BREAST LUMPECTOMY WITH RADIOACTIVE SEED LOCALIZATION;  Surgeon: Deward Null III, MD;  Location: [facility];  Service: General;  Laterality: Right;   COLONOSCOPY  03/20/2020   Most recent had one 10+ years prior also   THYROID  CYST EXCISION     benign   TONSILLECTOMY     WISDOM TOOTH EXTRACTION      Current Outpatient Medications on File Prior to Visit  Medication Sig   CALCIUM PO Take by mouth. 1200mg -Vit D-25mg    estradiol  (ESTRACE   VAGINAL) 0.1 MG/GM vaginal cream Rub pea size amount each night  3 times a week   MAGNESIUM PO Take by mouth. 500mg    Multiple Vitamin (MULTIVITAMIN PO) Take by mouth. +Areds2   No current facility-administered medications on file prior to visit.    No Known Allergies   Social History   Socioeconomic History   Marital status: Married    Spouse name: Not on file   Number of children: Not on file   Years of education: Not on file   Highest education level: Master's degree (e.g., MA, MS, MEng, MEd, MSW, MBA)  Occupational History   Not on file  Tobacco Use   Smoking status: Never   Smokeless tobacco: Never  Vaping Use   Vaping status: Never Used  Substance and Sexual Activity   Alcohol use: Yes    Alcohol/week: 7.0 standard drinks of alcohol    Types: 7 Glasses of wine per week   Drug use: No   Sexual activity: Not Currently     Partners: Male    Birth control/protection: Post-menopausal    Comment: <5 sexual partners, >12 y/o, no STD  Other Topics Concern   Not on file  Social History Narrative   Not on file   Social Drivers of Health   Financial Resource Strain: Low Risk  (09/10/2023)   Overall Financial Resource Strain (CARDIA)    Difficulty of Paying Living Expenses: Not hard at all  Food Insecurity: No Food Insecurity (09/10/2023)   Hunger Vital Sign    Worried About Running Out of Food in the Last Year: Never true    Ran Out of Food in the Last Year: Never true  Transportation Needs: No Transportation Needs (09/10/2023)   PRAPARE - Administrator, Civil Service (Medical): No    Lack of Transportation (Non-Medical): No  Physical Activity: Sufficiently Active (09/10/2023)   Exercise Vital Sign    Days of Exercise per Week: 7 days    Minutes of Exercise per Session: 60 min  Stress: No Stress Concern Present (09/10/2023)   [employer] of Occupational Health - Occupational Stress Questionnaire    Feeling of Stress: Not at all  Social Connections: Moderately Integrated (09/10/2023)   Social Connection and Isolation Panel    Frequency of Communication with Friends and Family: More than three times a week    Frequency of Social Gatherings with Friends and Family: More than three times a week    Attends Religious Services: Never    Database administrator or Organizations: Yes    Attends Engineer, structural: More than 4 times per year    Marital Status: Married  Catering manager Violence: Unknown (05/07/2021)   Received from [facility]   HITS    Physically Hurt: Not on file    Insult or Talk Down To: Not on file    Threaten Physical Harm: Not on file    Scream or Curse: Not on file   Social History   Tobacco Use  Smoking Status Never  Smokeless Tobacco Never   Social History   Substance and Sexual Activity  Alcohol Use Yes   Alcohol/week: 7.0 standard drinks of alcohol    Types: 7 Glasses of wine per week    Family History  Problem Relation Age of Onset   Breast cancer Mother 70   Cancer Father    Cancer Sister        lung   Cancer Brother  esophageal   Esophageal cancer Brother    Colon cancer Neg Hx    Colon polyps Neg Hx    Rectal cancer Neg Hx    Stomach cancer Neg Hx      ROS: Denies fever, fatigue, unexplained weight loss/gain, hearing or vision changes, cardiac or respiratory complaints. Denies neurological deficits, musculoskeletal complaints, gastrointestinal complaints, mental health complaints, and skin changes.   Objective:   Today's Vitals   09/13/23 0921  BP: 104/70  Pulse: 69  Temp: 98.5 F (36.9 C)  TempSrc: Temporal  SpO2: 100%  Weight: 133 lb 3.2 oz (60.4 kg)  Height: 4' 11.25 (1.505 m)    GENERAL APPEARANCE: Well-appearing, in NAD. Well nourished.  SKIN: Pink, warm and dry. Turgor normal. No rash, lesion, ulceration, or ecchymoses. Hair evenly distributed.  HEENT: HEAD: Normocephalic.  EYES: PERRLA. EOMI. Lids intact w/o defect. Sclera white, Conjunctiva pink w/o exudate.  EARS: External ear w/o redness, swelling, masses or lesions. EAC clear. TM's intact, translucent w/o bulging, appropriate landmarks visualized. Appropriate acuity to conversational tones.  NOSE: Septum midline w/o deformity. Nares patent, mucosa pink and non-inflamed w/o drainage.  THROAT: Uvula midline. Oropharynx clear. Tonsils absent. Oral mucosa pink and moist.  NECK: Supple, Trachea midline. Full ROM w/o pain or tenderness. No lymphadenopathy. Thyroid  non-tender w/o enlargement or palpable masses.  RESPIRATORY: Chest wall symmetrical w/o masses. Respirations even and non-labored. Breath sounds clear to auscultation bilaterally. No wheezes, rales, rhonchi, or crackles. CARDIAC: S1, S2 present, regular rate and rhythm. No gallops, murmurs, rubs, or clicks. No carotid bruits. Capillary refill <2 seconds. Peripheral pulses 2+  bilaterally. GI: Abdomen soft w/o distention. Normoactive bowel sounds. No palpable masses or tenderness. No guarding or rebound tenderness. Liver and spleen w/o tenderness or enlargement. No CVA tenderness.  MSK: Muscle tone and strength appropriate for age, w/o atrophy or abnormal movement.  EXTREMITIES: Active ROM intact, w/o tenderness, crepitus, or contracture. No obvious joint deformities or effusions. No clubbing, edema, or cyanosis.  NEUROLOGIC: CN's II-XII intact. Motor strength symmetrical with no obvious weakness. No sensory deficits. Steady, even gait.  PSYCH/MENTAL STATUS: Alert, oriented x 3. Cooperative, appropriate mood and affect.    Results for orders placed or performed in visit on 04/17/23  Surgical pathology( [place]/ [facility])   Collection Time: 04/18/23 10:38 AM  Result Value Ref Range   SURGICAL PATHOLOGY      SURGICAL PATHOLOGY CASE: [id number] PATIENT: Izzie Ormond Surgical Pathology Report     Clinical History: ASCUS with positive high risk HPV (cm)     FINAL MICROSCOPIC DIAGNOSIS:  A. ENDOCERVIX, CURETTAGE: -  Predominantly mucus with a scant strip of immature squamous metaplasia, negative for dysplasia (no endocervical mucosa is present in the curettage specimen).  B. CERVIX, 3 O'CLOCK, BIOPSY: -  Squamous mucosa with minimal koilocytic type atypia suggestive of low-grade squamous intraepithelial lesion (no transformation zone is present in the biopsy specimen).  C. CERVIX, 7 O'CLOCK, BIOPSY: Extremely scant fragment of squamous mucosa with atypia most consistent with low-grade squamous intraepithelial lesion, but overall evaluation is limited by the scant nature of the specimen.  D. 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   GROSS DESCRIPTION:  A. Rece ived labeled with the patient's name and ECC is clear, colorless formalin.  Submitted for cell block.  B. Received in formalin labeled with the patients name and 3 oclock is a 0.5 cm piece of tan soft tissue, submitted in toto in a single  cassette.  C. Received in formalin labeled with the patients name and 7 oclock is a 0.2 cm piece of tan soft tissue, submitted in toto in a single cassette.  D. Received in formalin labeled with the patients name and 12 oclock is a 0.1 cm piece of tan soft tissue, submitted in toto in a single cassette.  (LEF 04/18/2023)  Final Diagnosis performed by Mark LeGolvan DO.   Electronically signed 04/19/2023 Technical component performed at [employer]. [facility], [address] [place], [place] [address].  Professional component performed at [facility], [address], [place], [place] [address].  Immunohistochemistry Technical component (if applicable) was performed at [facility]. [address], [place], [place] [address].   IMMUNOHISTOCHEMISTRY DISCLAIMER (if applicable): Some of these immunohistochemical stains may have been developed and the performance characteristics determine by [facility]. Some may not have been cleared or approved by the U.S. Food and Drug Administration. The FDA has determined that such clearance or approval is not necessary. This test is used for clinical purposes. It should not be regarded as investigational or for research. This laboratory is certified under the Clinical Laboratory Improvement Amendments of 1988 (CLIA-88) as qualified to perform high complexity clinical laboratory testing.  The controls stained appropriately.   IHC stains are performed on formalin fixed, paraffin embedded tissue using a 3,3diaminobenzidine (DAB) chromogen and Leica Bond Autostainer System. The staining intensity of the nucleus is score manually and is reported as the percentage of tumor cell nuclei demons trating specific nuclear  staining. The specimens are fixed in 10% Neutral Formalin for at least 6 hours and up to 72hrs. These tests are validated on decalcified tissue. Results should be interpreted with caution given the possibility of false negative results on decalcified specimens. Antibody Clones are as follows ER-clone 26F, PR-clone 16, Ki67- clone MM1. Some of these immunohistochemical stains may have been developed and the performance characteristics determined by [facility].     Assessment & Plan:  Encounter for general adult medical examination without abnormal findings -     CBC with Differential/Platelet; Future -     Comprehensive metabolic panel with GFR; Future -     Lipid panel; Future -     TSH; Future    Orders Placed This Encounter  Procedures   CBC with Differential/Platelet    Standing Status:   Future    Expiration Date:   03/15/2024   Comprehensive metabolic panel with GFR    Standing Status:   Future    Expiration Date:   03/15/2024   Lipid panel    Standing Status:   Future    Expiration Date:   03/15/2024   TSH    Standing Status:   Future    Expiration Date:   03/15/2024    PATIENT COUNSELING:  - Encouraged a healthy well-balanced diet. Patient may adjust caloric intake to maintain or achieve ideal body weight. May reduce intake of dietary saturated fat and total fat and have adequate dietary potassium and calcium preferably from fresh fruits, vegetables, and low-fat dairy products.   - Advised to avoid cigarette smoking. - Discussed with the patient that most people either abstain from alcohol or drink within safe limits (<=14/week and <=4 drinks/occasion for males, <=7/weeks and <= 3 drinks/occasion for females) and that the risk for alcohol disorders and other health effects rises proportionally with the number of drinks per week and how often a drinker exceeds daily limits. -  Discussed cessation/primary prevention of drug use and availability of treatment for abuse.   - Discussed sexually transmitted diseases, avoidance of unintended pregnancy and contraceptive alternatives.  - Stressed the importance of regular exercise - Injury prevention: Discussed safety belts, safety helmets, smoke detector, smoking near bedding or upholstery.  - Dental health: Discussed importance of regular tooth brushing, flossing, and dental visits.   NEXT PREVENTATIVE PHYSICAL DUE IN 1 YEAR.  Return in about 1 year (around 09/12/2024) for Annual Physical Exam with fasting lab work.  Rosina Senters, FNP

## 2023-09-18 ENCOUNTER — Other Ambulatory Visit (INDEPENDENT_AMBULATORY_CARE_PROVIDER_SITE_OTHER)

## 2023-09-18 DIAGNOSIS — Z0000 Encounter for general adult medical examination without abnormal findings: Secondary | ICD-10-CM | POA: Diagnosis not present

## 2023-09-18 DIAGNOSIS — Z13220 Encounter for screening for lipoid disorders: Secondary | ICD-10-CM | POA: Diagnosis not present

## 2023-09-18 LAB — TSH: TSH: 3.11 u[IU]/mL (ref 0.35–5.50)

## 2023-09-18 LAB — COMPREHENSIVE METABOLIC PANEL WITH GFR
ALT: 13 U/L (ref 0–35)
AST: 22 U/L (ref 0–37)
Albumin: 4.2 g/dL (ref 3.5–5.2)
Alkaline Phosphatase: 51 U/L (ref 39–117)
BUN: 14 mg/dL (ref 6–23)
CO2: 30 meq/L (ref 19–32)
Calcium: 8.9 mg/dL (ref 8.4–10.5)
Chloride: 104 meq/L (ref 96–112)
Creatinine, Ser: 0.68 mg/dL (ref 0.40–1.20)
GFR: 90.51 mL/min (ref >60.00)
Glucose, Bld: 73 mg/dL (ref 70–99)
Potassium: 3.8 meq/L (ref 3.5–5.1)
Sodium: 141 meq/L (ref 135–145)
Total Bilirubin: 0.4 mg/dL (ref 0.2–1.2)
Total Protein: 6.7 g/dL (ref 6.0–8.3)

## 2023-09-18 LAB — CBC WITH DIFFERENTIAL/PLATELET
Basophils Absolute: 0 K/uL (ref 0.0–0.1)
Basophils Relative: 0.6 % (ref 0.0–3.0)
Eosinophils Absolute: 0.1 K/uL (ref 0.0–0.7)
Eosinophils Relative: 3 % (ref 0.0–5.0)
HCT: 41.5 % (ref 36.0–46.0)
Hemoglobin: 13.7 g/dL (ref 12.0–15.0)
Lymphocytes Relative: 22 % (ref 12.0–46.0)
Lymphs Abs: 0.9 K/uL (ref 0.7–4.0)
MCHC: 33.1 g/dL (ref 30.0–36.0)
MCV: 90.4 fl (ref 78.0–100.0)
Monocytes Absolute: 0.3 K/uL (ref 0.1–1.0)
Monocytes Relative: 7 % (ref 3.0–12.0)
Neutro Abs: 2.7 K/uL (ref 1.4–7.7)
Neutrophils Relative %: 67.4 % (ref 43.0–77.0)
Platelets: 185 K/uL (ref 150.0–400.0)
RBC: 4.59 Mil/uL (ref 3.87–5.11)
RDW: 13.2 % (ref 11.5–15.5)
WBC: 4 K/uL (ref 4.0–10.5)

## 2023-09-18 LAB — LIPID PANEL
Cholesterol: 218 mg/dL — ABNORMAL HIGH (ref 0–200)
HDL: 69.1 mg/dL (ref >39.00)
LDL Cholesterol: 136 mg/dL — ABNORMAL HIGH (ref 0–99)
NonHDL: 148.8
Total CHOL/HDL Ratio: 3
Triglycerides: 66 mg/dL (ref 0.0–149.0)
VLDL: 13.2 mg/dL (ref 0.0–40.0)

## 2023-09-19 ENCOUNTER — Ambulatory Visit: Payer: Self-pay | Admitting: Internal Medicine

## 2023-10-17 ENCOUNTER — Ambulatory Visit: Admitting: Obstetrics and Gynecology

## 2023-10-30 ENCOUNTER — Encounter: Payer: Self-pay | Admitting: Obstetrics and Gynecology

## 2023-10-30 ENCOUNTER — Ambulatory Visit (INDEPENDENT_AMBULATORY_CARE_PROVIDER_SITE_OTHER): Admitting: Obstetrics and Gynecology

## 2023-10-30 ENCOUNTER — Other Ambulatory Visit (HOSPITAL_COMMUNITY)
Admission: RE | Admit: 2023-10-30 | Discharge: 2023-10-30 | Disposition: A | Discharge status: Home / Self Care | Source: Ambulatory Visit | Attending: Obstetrics and Gynecology | Admitting: Obstetrics and Gynecology

## 2023-10-30 VITALS — BP 112/80 | HR 60 | Ht 58.86 in | Wt 133.8 lb

## 2023-10-30 DIAGNOSIS — Z23 Encounter for immunization: Secondary | ICD-10-CM | POA: Diagnosis not present

## 2023-10-30 DIAGNOSIS — B977 Papillomavirus as the cause of diseases classified elsewhere: Secondary | ICD-10-CM | POA: Diagnosis not present

## 2023-10-30 DIAGNOSIS — Z1151 Encounter for screening for human papillomavirus (HPV): Secondary | ICD-10-CM | POA: Insufficient documentation

## 2023-10-30 DIAGNOSIS — Z01411 Encounter for gynecological examination (general) (routine) with abnormal findings: Secondary | ICD-10-CM | POA: Diagnosis present

## 2023-10-30 DIAGNOSIS — N870 Mild cervical dysplasia: Secondary | ICD-10-CM | POA: Diagnosis not present

## 2023-10-30 NOTE — Progress Notes (Signed)
 Colposcopy Procedure Note Christina Gross 10/30/2023  Indications: ASCUS HPV DNA positive Genotype positive for 16 Colpo CIN 1  Here today for repeat pap smear Gardesil #2 of 3 given today     Component Value Date/Time   DIAGPAP (A) 03/20/2023 1046    - Atypical squamous cells of undetermined significance (ASC-US )   DIAGPAP  01/16/2020 1632    - Negative for intraepithelial lesion or malignancy (NILM)   HPVHIGH Positive (A) 03/20/2023 1046   HPVHIGH Negative 01/16/2020 1632   ADEQPAP  03/20/2023 1046    Satisfactory for evaluation; transformation zone component ABSENT.   ADEQPAP  01/16/2020 1632    Satisfactory for evaluation; transformation zone component PRESENT.    High Risk HPV: Positive  Adequacy:  Satisfactory for evaluation, transformation zone component PRESENT  Diagnosis:  Atypical squamous cells of undetermined significance (ASC-US )  Speculum placed in vagina and excellent visualization of cervix achieved, cervix swabbed x 3 with acetic acid solution.  Impression:cin1  Satisfactory( ECC zone seen): yes  Past Medical History:  Diagnosis Date   Genital warts    Hx of adenomatous polyp of colon 03/20/2020   Intraductal papilloma 08/16/2022   Past Surgical History:  Procedure Laterality Date   BREAST BIOPSY Right 06/04/2015   Intraductal papilloma   BREAST EXCISIONAL BIOPSY Right 07/16/2015   Intraductal papilloma   BREAST LUMPECTOMY WITH RADIOACTIVE SEED LOCALIZATION Right 07/16/2015   Procedure: RIGHT BREAST LUMPECTOMY WITH RADIOACTIVE SEED LOCALIZATION;  Surgeon: Deward Null III, MD;  Location: [facility];  Service: General;  Laterality: Right;   COLONOSCOPY  03/20/2020   Most recent had one 10+ years prior also   THYROID  CYST EXCISION     benign   TONSILLECTOMY     WISDOM TOOTH EXTRACTION      SVE: no lesions, atrophic no CMT  Plan: CIN1, genotype positive HPV DNA 16  Gardesil 2 72f 3 given today Repeat pap smear sent To notify  patient of the results  Christina Gross

## 2023-11-02 ENCOUNTER — Ambulatory Visit: Payer: Self-pay | Admitting: Obstetrics and Gynecology

## 2023-11-02 LAB — CYTOLOGY - PAP: Diagnosis: NEGATIVE

## 2024-03-27 ENCOUNTER — Ambulatory Visit: Admitting: Obstetrics and Gynecology

## 2024-04-30 ENCOUNTER — Ambulatory Visit

## 2024-09-13 ENCOUNTER — Encounter: Admitting: Internal Medicine
